# Patient Record
Sex: Male | Born: 1940 | Race: White | Hispanic: No | Smoking: Never smoker
Health system: Southern US, Community
[De-identification: ages and names within clinical notes are randomized; demographics above are authoritative.]

## PROBLEM LIST (undated history)

## (undated) DIAGNOSIS — K219 Gastro-esophageal reflux disease without esophagitis: Secondary | ICD-10-CM

## (undated) DIAGNOSIS — M79609 Pain in unspecified limb: Secondary | ICD-10-CM

## (undated) DIAGNOSIS — I10 Essential (primary) hypertension: Secondary | ICD-10-CM

## (undated) DIAGNOSIS — Z87442 Personal history of urinary calculi: Secondary | ICD-10-CM

## (undated) DIAGNOSIS — M1990 Unspecified osteoarthritis, unspecified site: Secondary | ICD-10-CM

## (undated) DIAGNOSIS — G4700 Insomnia, unspecified: Secondary | ICD-10-CM

## (undated) DIAGNOSIS — N529 Male erectile dysfunction, unspecified: Secondary | ICD-10-CM

## (undated) DIAGNOSIS — E7800 Pure hypercholesterolemia, unspecified: Secondary | ICD-10-CM

## (undated) DIAGNOSIS — E119 Type 2 diabetes mellitus without complications: Principal | ICD-10-CM

## (undated) DIAGNOSIS — R21 Rash and other nonspecific skin eruption: Secondary | ICD-10-CM

## (undated) DIAGNOSIS — Z86010 Personal history of colonic polyps: Secondary | ICD-10-CM

## (undated) DIAGNOSIS — K222 Esophageal obstruction: Secondary | ICD-10-CM

## (undated) HISTORY — PX: COLONOSCOPY: SHX174

## (undated) HISTORY — DX: Essential (primary) hypertension: I10

## (undated) HISTORY — DX: Esophageal obstruction: K22.2

## (undated) HISTORY — DX: Male erectile dysfunction, unspecified: N52.9

## (undated) HISTORY — DX: Rash and other nonspecific skin eruption: R21

## (undated) HISTORY — DX: Personal history of colonic polyps: Z86.010

## (undated) HISTORY — DX: Type 2 diabetes mellitus without complications: E11.9

## (undated) HISTORY — DX: Insomnia, unspecified: G47.00

## (undated) HISTORY — DX: Unspecified osteoarthritis, unspecified site: M19.90

## (undated) HISTORY — PX: TONSILLECTOMY AND ADENOIDECTOMY: SHX28

## (undated) HISTORY — PX: UPPER GASTROINTESTINAL ENDOSCOPY: SHX188

## (undated) HISTORY — DX: Gastro-esophageal reflux disease without esophagitis: K21.9

## (undated) HISTORY — DX: Pain in unspecified limb: M79.609

## (undated) HISTORY — DX: Personal history of urinary calculi: Z87.442

## (undated) HISTORY — DX: Pure hypercholesterolemia, unspecified: E78.00

---

## 2000-09-25 ENCOUNTER — Ambulatory Visit (HOSPITAL_COMMUNITY): Admission: RE | Admit: 2000-09-25 | Discharge: 2000-09-25 | Payer: Self-pay | Admitting: Gastroenterology

## 2000-09-25 ENCOUNTER — Encounter (INDEPENDENT_AMBULATORY_CARE_PROVIDER_SITE_OTHER): Payer: Self-pay | Admitting: Specialist

## 2000-12-03 HISTORY — PX: CATARACT EXTRACTION, BILATERAL: SHX1313

## 2000-12-30 ENCOUNTER — Ambulatory Visit (HOSPITAL_COMMUNITY): Admission: RE | Admit: 2000-12-30 | Discharge: 2000-12-30 | Payer: Self-pay | Admitting: Orthopedic Surgery

## 2000-12-30 ENCOUNTER — Encounter: Payer: Self-pay | Admitting: Orthopedic Surgery

## 2004-01-20 ENCOUNTER — Encounter: Admission: RE | Admit: 2004-01-20 | Discharge: 2004-01-20 | Payer: Self-pay | Admitting: Endocrinology

## 2004-08-24 ENCOUNTER — Ambulatory Visit: Payer: Self-pay | Admitting: Endocrinology

## 2005-01-05 ENCOUNTER — Ambulatory Visit: Payer: Self-pay | Admitting: Endocrinology

## 2005-04-03 ENCOUNTER — Ambulatory Visit: Payer: Self-pay | Admitting: Endocrinology

## 2005-05-25 ENCOUNTER — Ambulatory Visit: Payer: Self-pay | Admitting: Internal Medicine

## 2005-10-16 ENCOUNTER — Ambulatory Visit: Payer: Self-pay | Admitting: Endocrinology

## 2005-12-03 HISTORY — PX: REPLACEMENT TOTAL KNEE: SUR1224

## 2005-12-20 ENCOUNTER — Inpatient Hospital Stay (HOSPITAL_COMMUNITY): Admission: RE | Admit: 2005-12-20 | Discharge: 2005-12-24 | Payer: Self-pay | Admitting: Specialist

## 2006-04-30 ENCOUNTER — Ambulatory Visit: Payer: Self-pay | Admitting: Endocrinology

## 2006-05-21 ENCOUNTER — Ambulatory Visit: Payer: Self-pay | Admitting: Endocrinology

## 2006-05-23 ENCOUNTER — Ambulatory Visit: Payer: Self-pay | Admitting: Endocrinology

## 2006-06-13 ENCOUNTER — Ambulatory Visit: Payer: Self-pay | Admitting: Internal Medicine

## 2006-07-16 ENCOUNTER — Ambulatory Visit: Payer: Self-pay | Admitting: Endocrinology

## 2006-07-30 ENCOUNTER — Ambulatory Visit: Payer: Self-pay

## 2006-07-30 HISTORY — PX: OTHER SURGICAL HISTORY: SHX169

## 2006-10-15 ENCOUNTER — Ambulatory Visit: Payer: Self-pay | Admitting: Gastroenterology

## 2006-10-28 IMAGING — CR DG CHEST 2V
2 series · 2 of 2 positions shown · non-contrast
Comparison: None

CLINICAL DATA: Right knee osteoarthritis, preop

CHEST - 2 VIEW:

[view not recorded (1 of 2)]
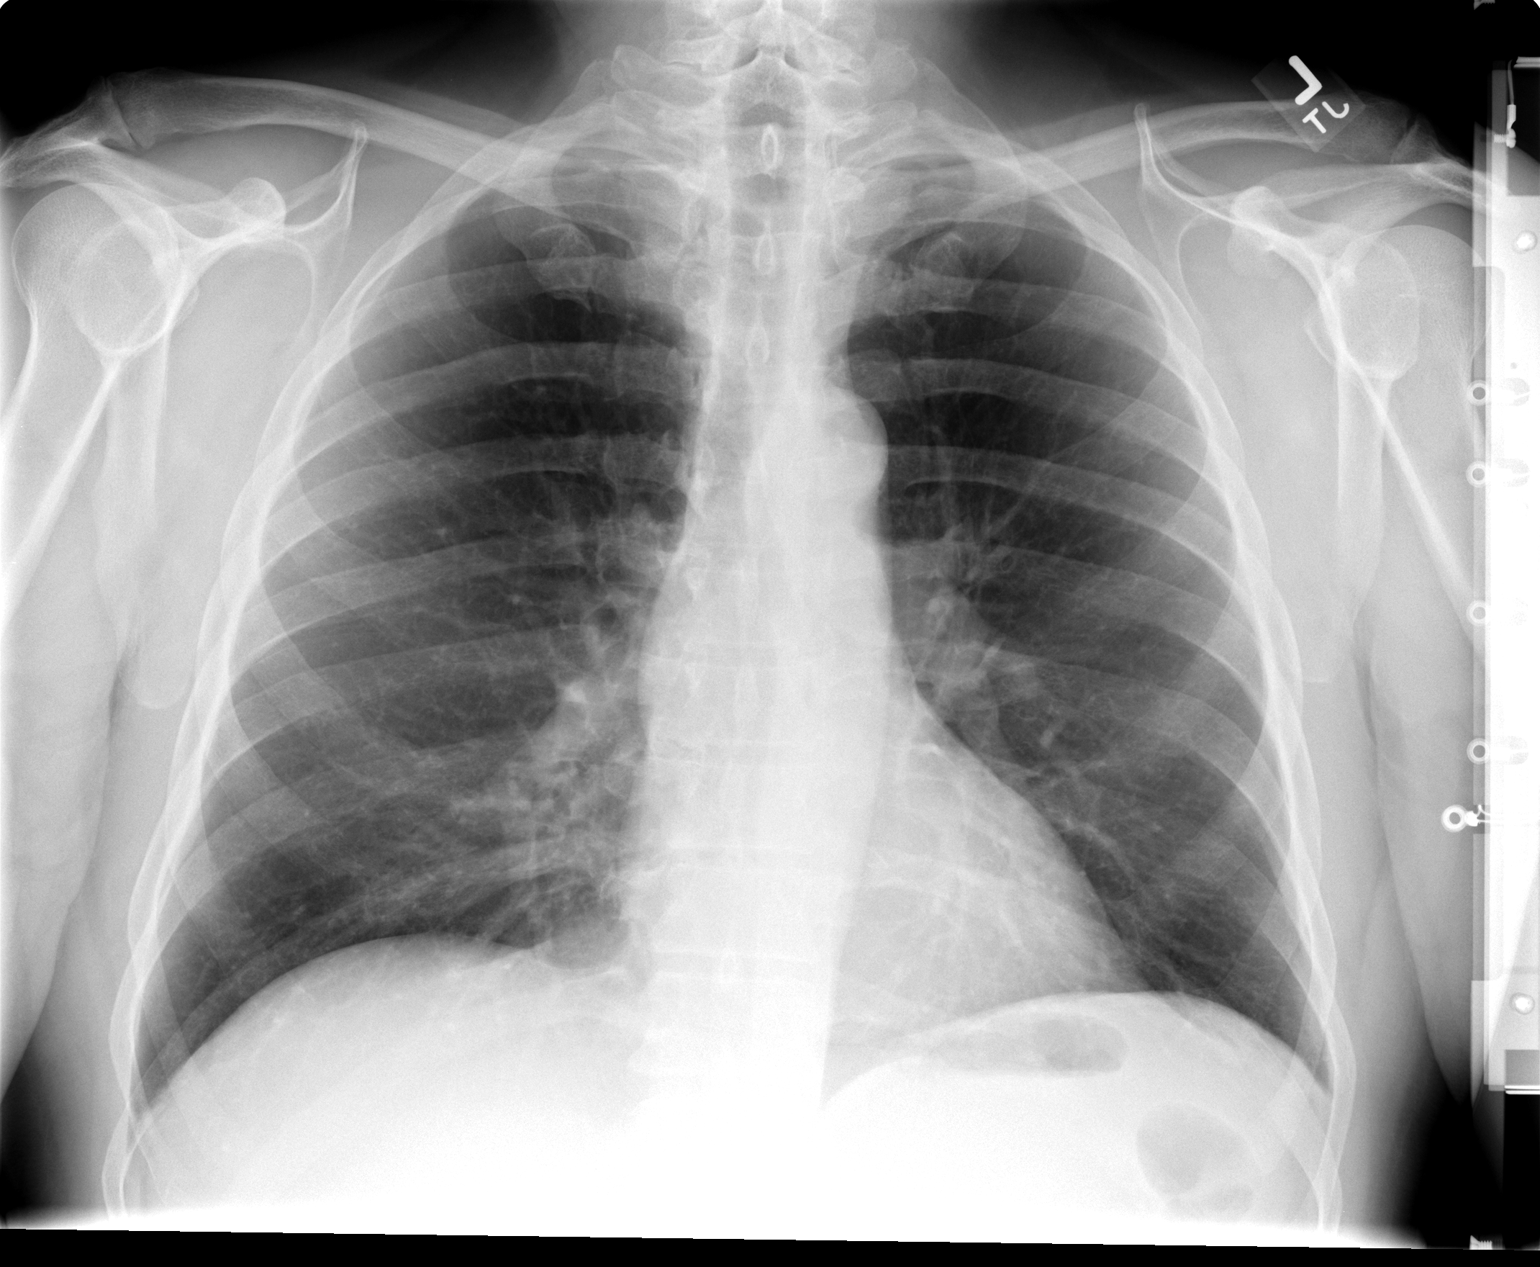

[view not recorded (2 of 2)]
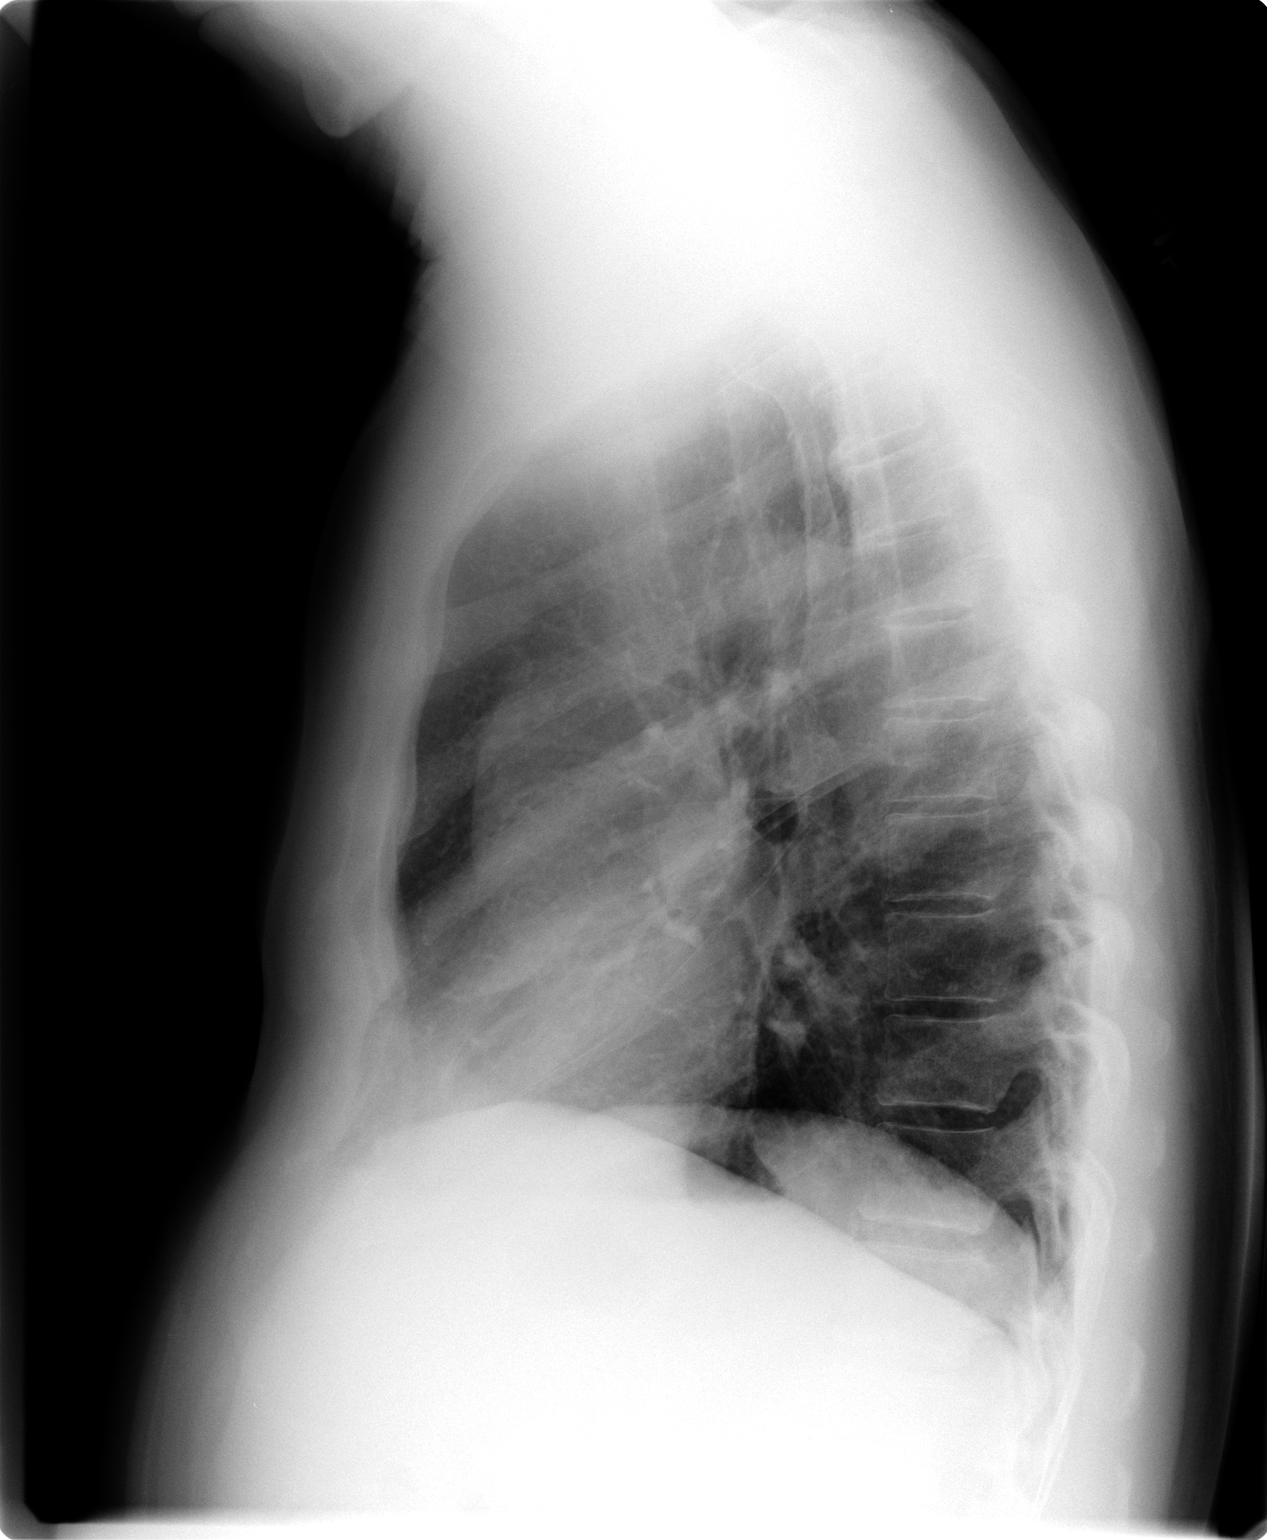

[2 of 2 positions shown; findings below may reference images not displayed]

FINDINGS: The heart size and mediastinal contours are within normal limits. 
Both lungs are clear.  The visualized skeletal structures are unremarkable.
IMPRESSION: No active cardiopulmonary disease

## 2006-11-04 IMAGING — CR DG KNEE 1-2V*R*
2 series · 2 of 2 positions shown · non-contrast
Comparison: none

CLINICAL DATA: Status-post right total knee arthroplasty. 
 RIGHT KNEE ? 2 VIEW:

[view not recorded (1 of 2)]
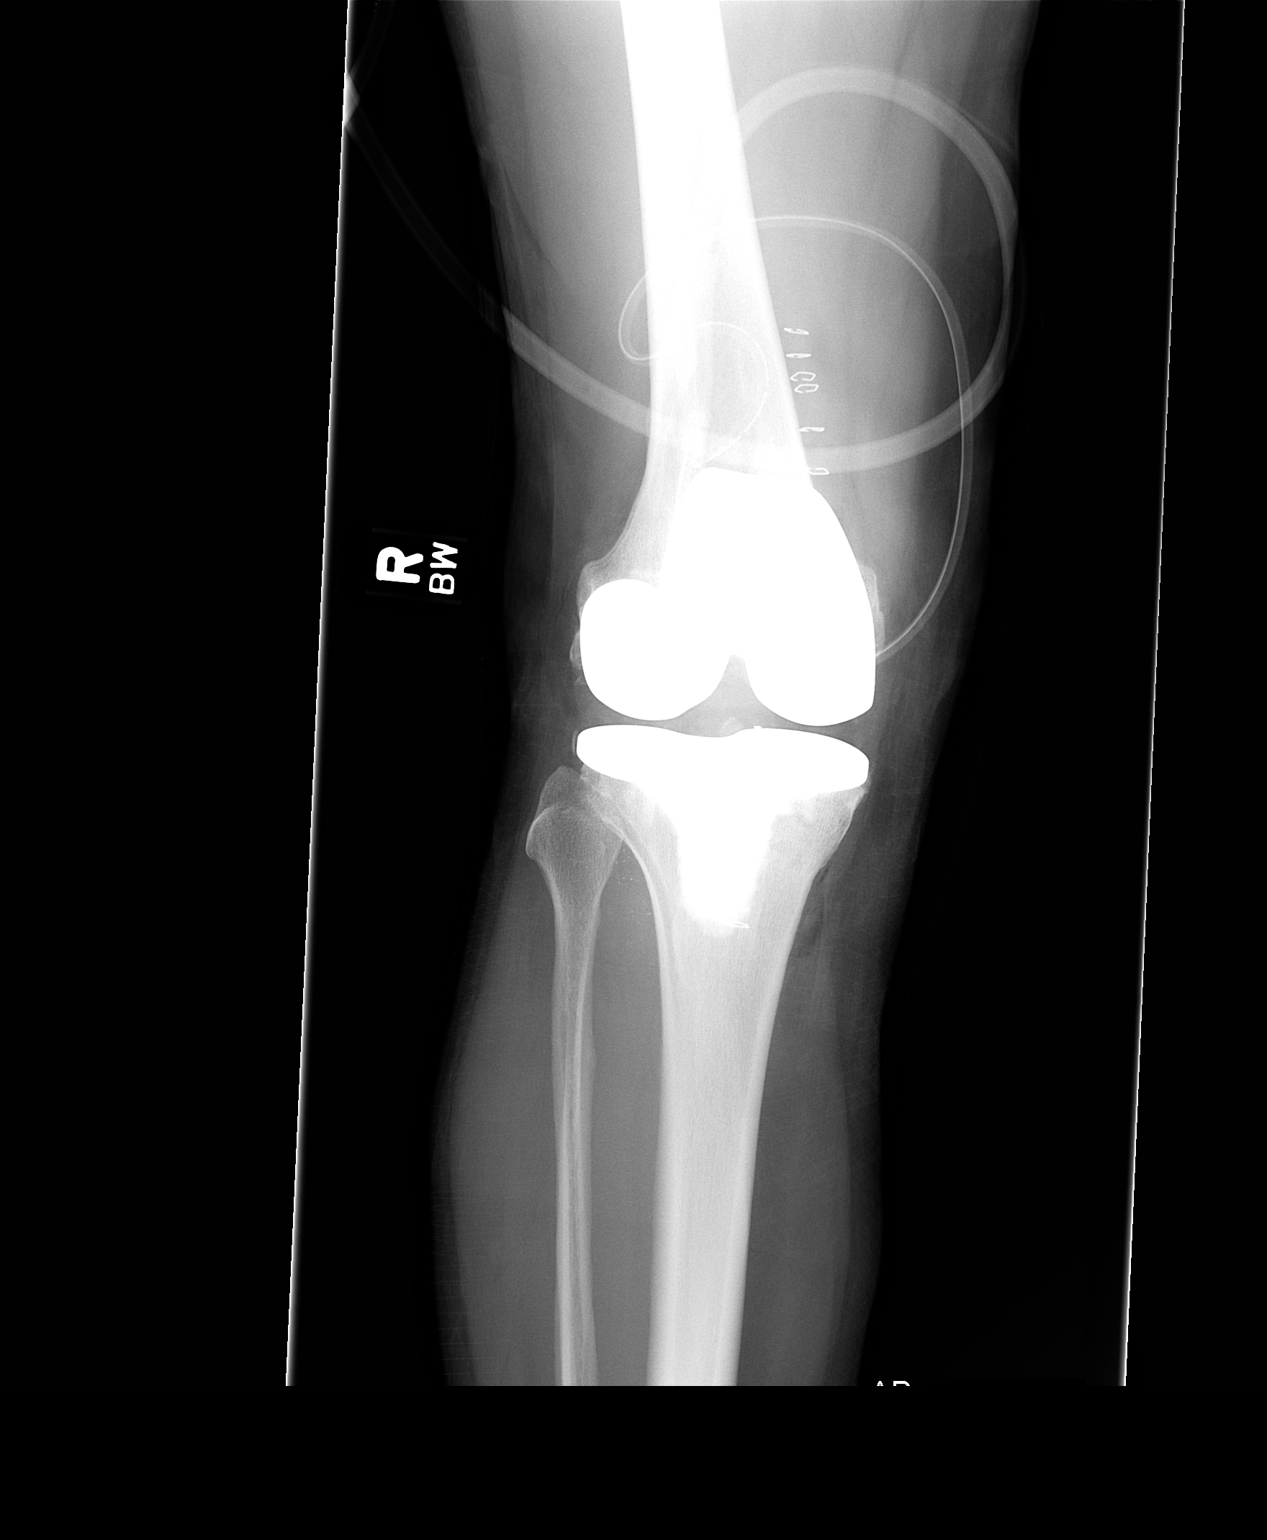

[view not recorded (2 of 2)]
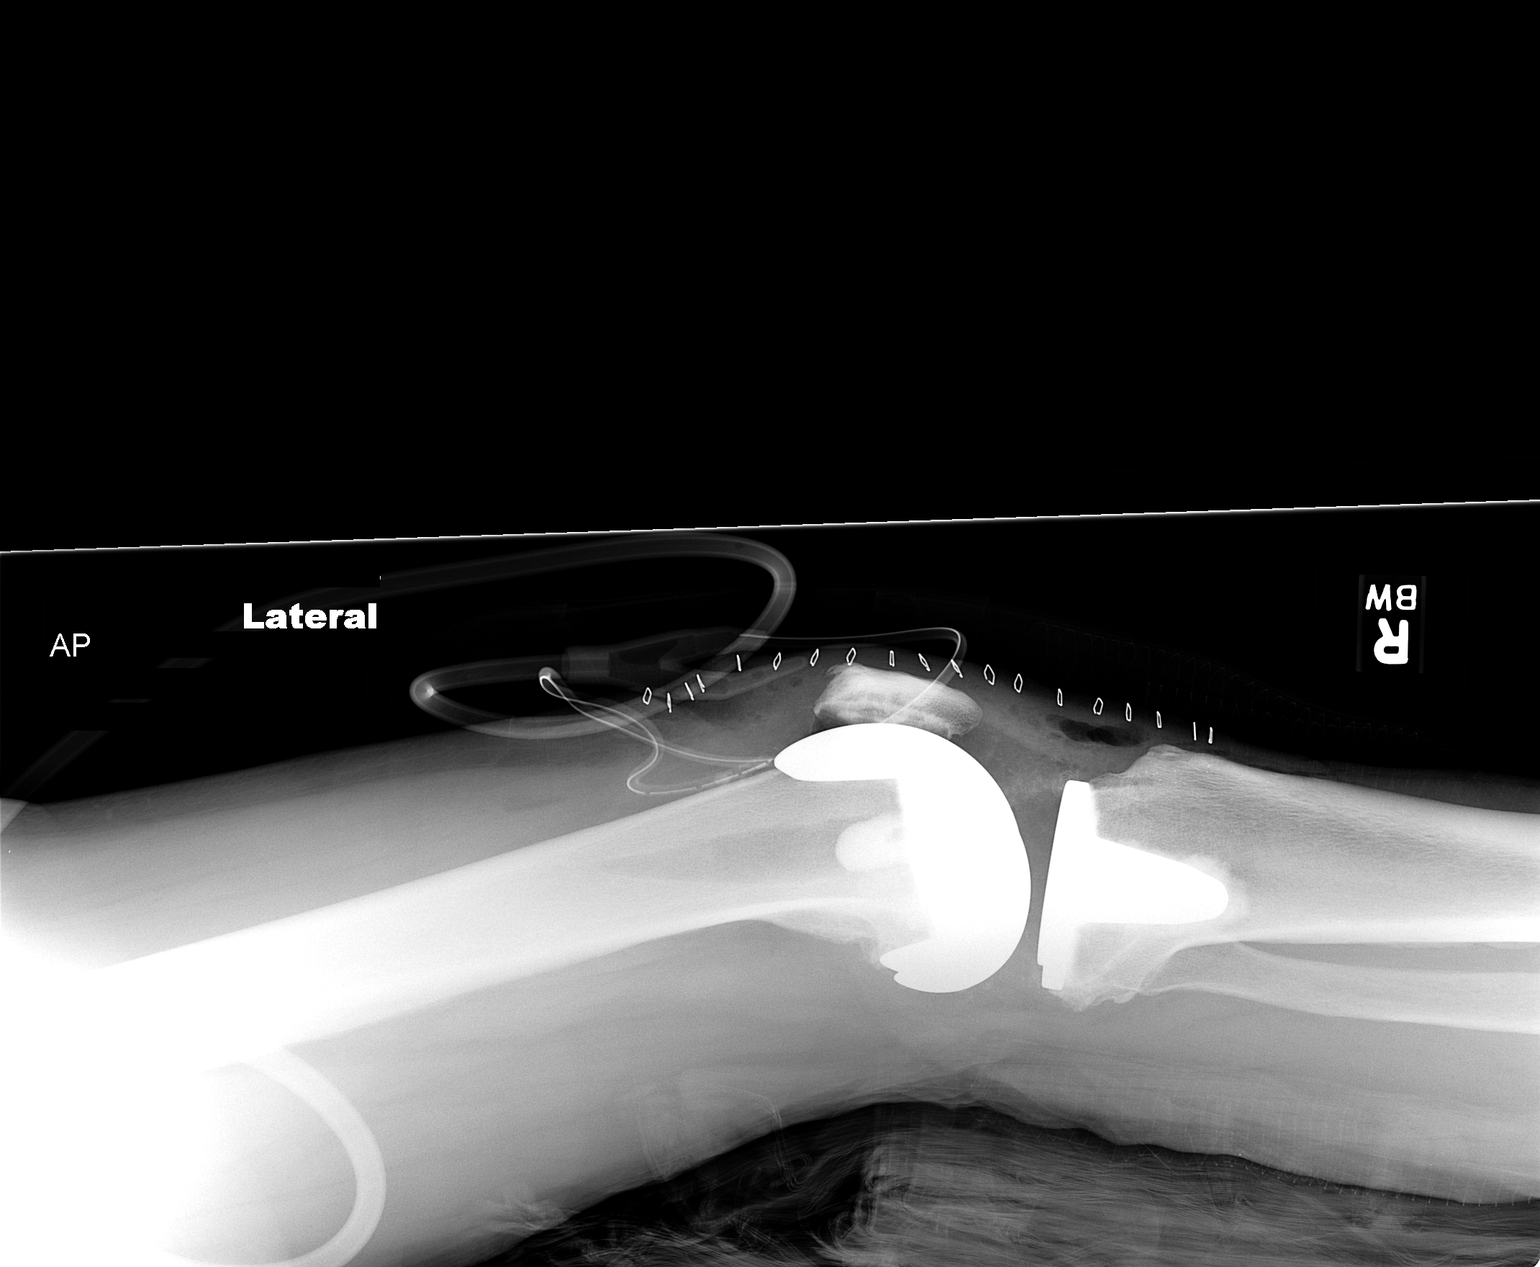

[2 of 2 positions shown; findings below may reference images not displayed]

FINDINGS: Two views obtained immediately after the surgical procedure demonstrate normal alignment of both tibial and femoral components.   Small amount of air in the anterior joint space on the cross-table lateral view.  Surgical drain in place.
IMPRESSION: Normal alignment following right knee arthroplasty.

## 2006-11-14 ENCOUNTER — Encounter (INDEPENDENT_AMBULATORY_CARE_PROVIDER_SITE_OTHER): Payer: Self-pay | Admitting: *Deleted

## 2006-11-14 ENCOUNTER — Ambulatory Visit: Payer: Self-pay | Admitting: Gastroenterology

## 2006-11-14 LAB — HM COLONOSCOPY

## 2007-07-30 ENCOUNTER — Encounter: Payer: Self-pay | Admitting: Endocrinology

## 2007-07-30 DIAGNOSIS — M1990 Unspecified osteoarthritis, unspecified site: Secondary | ICD-10-CM | POA: Insufficient documentation

## 2007-07-30 DIAGNOSIS — Z860100 Personal history of colon polyps, unspecified: Secondary | ICD-10-CM | POA: Insufficient documentation

## 2007-07-30 HISTORY — DX: Unspecified osteoarthritis, unspecified site: M19.90

## 2007-07-30 HISTORY — DX: Personal history of colon polyps, unspecified: Z86.0100

## 2007-07-30 HISTORY — DX: Personal history of colonic polyps: Z86.010

## 2007-10-22 ENCOUNTER — Ambulatory Visit: Payer: Self-pay | Admitting: Endocrinology

## 2007-10-22 DIAGNOSIS — E119 Type 2 diabetes mellitus without complications: Secondary | ICD-10-CM

## 2007-10-22 HISTORY — DX: Type 2 diabetes mellitus without complications: E11.9

## 2007-10-22 LAB — CONVERTED CEMR LAB
ALT: 28 units/L (ref 0–53)
Alkaline Phosphatase: 45 units/L (ref 39–117)
BUN: 14 mg/dL (ref 6–23)
Bilirubin, Direct: 0.1 mg/dL (ref 0.0–0.3)
Calcium: 9.6 mg/dL (ref 8.4–10.5)
Cholesterol: 299 mg/dL (ref 0–200)
Direct LDL: 216.5 mg/dL
Eosinophils Absolute: 0.2 10*3/uL (ref 0.0–0.6)
GFR calc Af Amer: 71 mL/min
GFR calc non Af Amer: 59 mL/min
HCT: 48.1 % (ref 39.0–52.0)
Hemoglobin, Urine: NEGATIVE
Hemoglobin: 16.8 g/dL (ref 13.0–17.0)
Hgb A1c MFr Bld: 6.5 % — ABNORMAL HIGH (ref 4.6–6.0)
Ketones, ur: NEGATIVE mg/dL
Leukocytes, UA: NEGATIVE
MCHC: 34.9 g/dL (ref 30.0–36.0)
Microalb Creat Ratio: 18.2 mg/g (ref 0.0–30.0)
Microalb, Ur: 1.7 mg/dL (ref 0.0–1.9)
Monocytes Relative: 8.3 % (ref 3.0–11.0)
Neutro Abs: 3.8 10*3/uL (ref 1.4–7.7)
Nitrite: NEGATIVE
Platelets: 247 10*3/uL (ref 150–400)
Specific Gravity, Urine: 1.015 (ref 1.000–1.03)
TSH: 1.09 microintl units/mL (ref 0.35–5.50)
Total CHOL/HDL Ratio: 7.9
Urobilinogen, UA: 0.2 (ref 0.0–1.0)
VLDL: 50 mg/dL — ABNORMAL HIGH (ref 0–40)
WBC: 7.2 10*3/uL (ref 4.5–10.5)
pH: 7 (ref 5.0–8.0)

## 2007-10-24 ENCOUNTER — Ambulatory Visit: Payer: Self-pay | Admitting: Endocrinology

## 2007-10-24 DIAGNOSIS — M79609 Pain in unspecified limb: Secondary | ICD-10-CM | POA: Insufficient documentation

## 2007-10-24 DIAGNOSIS — E7800 Pure hypercholesterolemia, unspecified: Secondary | ICD-10-CM

## 2007-10-24 HISTORY — DX: Pain in unspecified limb: M79.609

## 2007-10-24 HISTORY — DX: Pure hypercholesterolemia, unspecified: E78.00

## 2008-01-02 ENCOUNTER — Encounter: Payer: Self-pay | Admitting: Internal Medicine

## 2008-01-26 ENCOUNTER — Ambulatory Visit: Payer: Self-pay | Admitting: Endocrinology

## 2008-01-26 DIAGNOSIS — I10 Essential (primary) hypertension: Secondary | ICD-10-CM

## 2008-01-26 HISTORY — DX: Essential (primary) hypertension: I10

## 2008-01-29 ENCOUNTER — Telehealth (INDEPENDENT_AMBULATORY_CARE_PROVIDER_SITE_OTHER): Payer: Self-pay | Admitting: *Deleted

## 2008-02-26 ENCOUNTER — Telehealth (INDEPENDENT_AMBULATORY_CARE_PROVIDER_SITE_OTHER): Payer: Self-pay | Admitting: *Deleted

## 2008-02-27 ENCOUNTER — Ambulatory Visit: Payer: Self-pay | Admitting: Endocrinology

## 2008-04-09 ENCOUNTER — Ambulatory Visit (HOSPITAL_COMMUNITY): Admission: RE | Admit: 2008-04-09 | Discharge: 2008-04-09 | Payer: Self-pay | Admitting: Specialist

## 2008-04-09 LAB — CONVERTED CEMR LAB
Hgb A1c MFr Bld: 6.6 % — ABNORMAL HIGH (ref 4.6–6.0)
Total CHOL/HDL Ratio: 3.4
VLDL: 22 mg/dL (ref 0–40)

## 2008-09-17 ENCOUNTER — Encounter: Payer: Self-pay | Admitting: Endocrinology

## 2008-09-24 ENCOUNTER — Telehealth (INDEPENDENT_AMBULATORY_CARE_PROVIDER_SITE_OTHER): Payer: Self-pay | Admitting: *Deleted

## 2008-11-01 ENCOUNTER — Telehealth: Payer: Self-pay | Admitting: Endocrinology

## 2008-11-02 ENCOUNTER — Telehealth: Payer: Self-pay | Admitting: Endocrinology

## 2008-11-10 ENCOUNTER — Ambulatory Visit: Payer: Self-pay | Admitting: Endocrinology

## 2008-11-10 DIAGNOSIS — G4700 Insomnia, unspecified: Secondary | ICD-10-CM

## 2008-11-10 DIAGNOSIS — N529 Male erectile dysfunction, unspecified: Secondary | ICD-10-CM

## 2008-11-10 HISTORY — DX: Male erectile dysfunction, unspecified: N52.9

## 2008-11-10 HISTORY — DX: Insomnia, unspecified: G47.00

## 2008-11-16 ENCOUNTER — Telehealth (INDEPENDENT_AMBULATORY_CARE_PROVIDER_SITE_OTHER): Payer: Self-pay | Admitting: *Deleted

## 2008-12-06 ENCOUNTER — Telehealth: Payer: Self-pay | Admitting: Endocrinology

## 2009-03-11 ENCOUNTER — Telehealth (INDEPENDENT_AMBULATORY_CARE_PROVIDER_SITE_OTHER): Payer: Self-pay | Admitting: *Deleted

## 2009-03-22 ENCOUNTER — Ambulatory Visit: Payer: Self-pay | Admitting: Gastroenterology

## 2009-03-22 ENCOUNTER — Ambulatory Visit: Payer: Self-pay | Admitting: Endocrinology

## 2009-03-22 DIAGNOSIS — K222 Esophageal obstruction: Secondary | ICD-10-CM | POA: Insufficient documentation

## 2009-03-22 HISTORY — DX: Esophageal obstruction: K22.2

## 2009-05-06 ENCOUNTER — Ambulatory Visit: Payer: Self-pay | Admitting: Endocrinology

## 2009-05-06 DIAGNOSIS — R21 Rash and other nonspecific skin eruption: Secondary | ICD-10-CM

## 2009-05-06 HISTORY — DX: Rash and other nonspecific skin eruption: R21

## 2009-07-19 ENCOUNTER — Telehealth: Payer: Self-pay | Admitting: Endocrinology

## 2009-08-11 ENCOUNTER — Ambulatory Visit: Payer: Self-pay | Admitting: Endocrinology

## 2009-08-16 LAB — CONVERTED CEMR LAB
Cholesterol: 138 mg/dL (ref 0–200)
HDL: 31.5 mg/dL — ABNORMAL LOW (ref >39.00)
LDL Cholesterol: 77 mg/dL (ref 0–99)
Microalb, Ur: 0.5 mg/dL (ref 0.0–1.9)
VLDL: 30 mg/dL (ref 0.0–40.0)

## 2009-11-14 ENCOUNTER — Telehealth: Payer: Self-pay | Admitting: Endocrinology

## 2009-11-28 ENCOUNTER — Telehealth (INDEPENDENT_AMBULATORY_CARE_PROVIDER_SITE_OTHER): Payer: Self-pay | Admitting: *Deleted

## 2010-01-02 ENCOUNTER — Telehealth: Payer: Self-pay | Admitting: Endocrinology

## 2010-01-06 ENCOUNTER — Ambulatory Visit: Payer: Self-pay | Admitting: Endocrinology

## 2010-02-06 ENCOUNTER — Telehealth: Payer: Self-pay | Admitting: Endocrinology

## 2010-02-08 ENCOUNTER — Telehealth: Payer: Self-pay | Admitting: Endocrinology

## 2010-07-28 ENCOUNTER — Telehealth: Payer: Self-pay | Admitting: Endocrinology

## 2010-12-31 LAB — CONVERTED CEMR LAB
AST: 21 units/L (ref 0–37)
Albumin: 4 g/dL (ref 3.5–5.2)
Albumin: 4 g/dL (ref 3.5–5.2)
Alkaline Phosphatase: 44 units/L (ref 39–117)
Alkaline Phosphatase: 45 units/L (ref 39–117)
BUN: 15 mg/dL (ref 6–23)
BUN: 16 mg/dL (ref 6–23)
Basophils Absolute: 0 10*3/uL (ref 0.0–0.1)
Basophils Absolute: 0 10*3/uL (ref 0.0–0.1)
Basophils Relative: 0.1 % (ref 0.0–3.0)
Bilirubin Urine: NEGATIVE
Bilirubin Urine: NEGATIVE
CO2: 29 meq/L (ref 19–32)
Cholesterol: 111 mg/dL (ref 0–200)
Creatinine, Ser: 1.1 mg/dL (ref 0.4–1.5)
Creatinine,U: 124.4 mg/dL
Crystals: NEGATIVE
Eosinophils Absolute: 0.4 10*3/uL (ref 0.0–0.7)
Eosinophils Absolute: 0.4 10*3/uL (ref 0.0–0.7)
Eosinophils Relative: 3.7 % (ref 0.0–5.0)
GFR calc Af Amer: 86 mL/min
GFR calc non Af Amer: 71 mL/min
Glucose, Bld: 124 mg/dL — ABNORMAL HIGH (ref 70–99)
Glucose, Bld: 165 mg/dL — ABNORMAL HIGH (ref 70–99)
HCT: 45.9 % (ref 39.0–52.0)
HCT: 46.2 % (ref 39.0–52.0)
Hemoglobin, Urine: NEGATIVE
Hemoglobin: 15 g/dL (ref 13.0–17.0)
Hgb A1c MFr Bld: 6.8 % — ABNORMAL HIGH (ref 4.6–6.0)
Hgb A1c MFr Bld: 6.8 % — ABNORMAL HIGH (ref 4.6–6.5)
Ketones, ur: NEGATIVE mg/dL
Leukocytes, UA: NEGATIVE
Lymphs Abs: 1.8 10*3/uL (ref 0.7–4.0)
MCHC: 32.7 g/dL (ref 30.0–36.0)
MCHC: 33.3 g/dL (ref 30.0–36.0)
MCV: 89.6 fL (ref 78.0–100.0)
MCV: 91.1 fL (ref 78.0–100.0)
Microalb Creat Ratio: 4.8 mg/g (ref 0.0–30.0)
Monocytes Absolute: 0.4 10*3/uL (ref 0.1–1.0)
Monocytes Absolute: 0.9 10*3/uL (ref 0.1–1.0)
Neutro Abs: 3.3 10*3/uL (ref 1.4–7.7)
Neutro Abs: 7.4 10*3/uL (ref 1.4–7.7)
Neutrophils Relative %: 65 % (ref 43.0–77.0)
Nitrite: NEGATIVE
PSA: 1.06 ng/mL (ref 0.10–4.00)
Platelets: 226 10*3/uL (ref 150.0–400.0)
Potassium: 4.5 meq/L (ref 3.5–5.1)
RBC: 5.16 M/uL (ref 4.22–5.81)
RDW: 13.5 % (ref 11.5–14.6)
Sodium: 141 meq/L (ref 135–145)
Specific Gravity, Urine: 1.025 (ref 1.000–1.03)
TSH: 1.25 microintl units/mL (ref 0.35–5.50)
TSH: 1.47 microintl units/mL (ref 0.35–5.50)
Total Protein, Urine: NEGATIVE mg/dL
Total Protein: 7 g/dL (ref 6.0–8.3)
Total Protein: 7.2 g/dL (ref 6.0–8.3)
Urine Glucose: NEGATIVE mg/dL
Urobilinogen, UA: 0.2 (ref 0.0–1.0)
Urobilinogen, UA: 0.2 (ref 0.0–1.0)
VLDL: 27 mg/dL (ref 0–40)

## 2011-01-04 NOTE — Progress Notes (Signed)
Summary: temazepam  Phone Note Refill Request Message from:  Fax from Pharmacy on July 28, 2010 1:16 PM  Refills Requested: Medication #1:  TEMAZEPAM 30 MG  CAPS at bedtime as needed sleep   Dosage confirmed as above?Dosage Confirmed   Last Refilled: 06/23/2010   Notes: [facility] fax [phone] please advise  Initial call taken by: Brenton Grills MA,  July 28, 2010 1:16 PM  Follow-up for Phone Call        i printed Follow-up by: Minus Breeding MD,  July 28, 2010 1:53 PM  Additional Follow-up for Phone Call Additional follow up Details #1::        faxed to [facility] Additional Follow-up by: Brenton Grills MA,  July 28, 2010 2:11 PM    Prescriptions: TEMAZEPAM 30 MG  CAPS (TEMAZEPAM) at bedtime as needed sleep  #30 x 5   Entered and Authorized by:   Minus Breeding MD   Signed by:   Minus Breeding MD on 07/28/2010   Method used:   Print then Give to Patient   RxID:   [id number]

## 2011-01-04 NOTE — Progress Notes (Signed)
  Phone Note Refill Request Message from:  Fax from Pharmacy on February 06, 2010 10:34 AM  Refills Requested: Medication #1:  TEMAZEPAM 30 MG  CAPS at bedtime as needed sleep   Dosage confirmed as above?Dosage Confirmed Please advise. [facility] [address]  Initial call taken by: Josph Macho RMA,  February 06, 2010 10:34 AM  Follow-up for Phone Call        i printed Follow-up by: Minus Breeding MD,  February 06, 2010 12:32 PM  Additional Follow-up for Phone Call Additional follow up Details #1::        patient called and informed Additional Follow-up by: Sydell Axon,  February 06, 2010 12:56 PM    Prescriptions: TEMAZEPAM 30 MG  CAPS (TEMAZEPAM) at bedtime as needed sleep  #30 x 5   Entered and Authorized by:   Minus Breeding MD   Signed by:   Minus Breeding MD on 02/06/2010   Method used:   Print then Give to Patient   RxID:   [id number]   Appended Document:  rx faxed to pharmacy

## 2011-01-04 NOTE — Progress Notes (Signed)
Summary: labs  Phone Note Call from Patient Call back at Home Phone [phone]   Caller: Patient Summary of Call: pt called requesting results of labs done at last OV. okay to tell pt labs normal, continue same meds? Initial call taken by: Margaret Pyle, CMA,  February 08, 2010 3:15 PM  Follow-up for Phone Call        all very good, including a1c of 6.8 Follow-up by: Minus Breeding MD,  February 08, 2010 5:52 PM  Additional Follow-up for Phone Call Additional follow up Details #1::        left message on pt's VM stating the above Additional Follow-up by: Sherese Christopher February 09, 2010 8:24 AM

## 2011-01-04 NOTE — Progress Notes (Signed)
Summary: RX Request  Phone Note Call from Patient Call back at Home Phone [phone]   Summary of Call: Patient left message on triage that he needs an prescription renewed. I spoke with the patient and his CPX is scheduled for Friday. Patient was wondering if he could get a short term prescription for Temazepam or if we could ahead and refill, he left the remainder of his pills out of town. Please advise. Initial call taken by: Lucious Groves,  January 02, 2010 4:35 PM  Follow-up for Phone Call        i printed Follow-up by: Minus Breeding MD,  January 02, 2010 4:52 PM  Additional Follow-up for Phone Call Additional follow up Details #1::        rx faxed to pharmacy per pt request Additional Follow-up by: Margaret Pyle, CMA,  January 02, 2010 4:56 PM    Prescriptions: TEMAZEPAM 30 MG  CAPS (TEMAZEPAM) at bedtime as needed sleep  #30 x 0   Entered and Authorized by:   Minus Breeding MD   Signed by:   Minus Breeding MD on 01/02/2010   Method used:   Print then Give to Patient   RxID:   [id number]

## 2011-01-04 NOTE — Assessment & Plan Note (Signed)
Summary: YEARLY  STC   Vital Signs:  Patient profile:   70 year old male Height:      67 inches (170.18 cm) Weight:      180.25 pounds (81.93 kg) O2 Sat:      95 % on Room air Temp:     98.3 degrees F (36.83 degrees C) oral Pulse rate:   63 / minute BP sitting:   138 / 80  (left arm) Cuff size:   large  Vitals Entered By: Josph Macho CMA (January 06, 2010 10:04 AM)  O2 Flow:  Room air CC: Yearly/pt states he is no longer taking Triamcinolone, EQ Omepraxole, Doxycycline, or Crestor/ CF  Vision Screening:Left eye w/o correction: 20 / 20 Right Eye w/o correction: 20 / 20 Both eyes w/o correction:  20/ 20        Vision Entered By: Josph Macho CMA (January 06, 2010 10:45 AM)   CC:  Yearly/pt states he is no longer taking Triamcinolone, EQ Omepraxole, Doxycycline, and or Crestor/ CF.  History of Present Illness: here for regular wellness examination.  He's feeling pretty well in general, and does not drink or smoke.  he is physically active.  he denies h/o depression.  he is not at risk for falls.  home environment is safe.  he lives independently.     Current Medications (verified): 1)  Aspirin 325 Mg  Tbec (Aspirin) .... Take 1 By Mouth Qd 2)  Crestor 40 Mg  Tabs (Rosuvastatin Calcium) .... Take 1 By Mouth Qd 3)  Temazepam 30 Mg  Caps (Temazepam) .... At Bedtime As Needed Sleep 4)  Triamcinolone Acetonide 0.5 % Crea (Triamcinolone Acetonide) .... Three Times A Day As Needed Itching 5)  Eq Omeprazole 20 Mg Tbec (Omeprazole) .Marland Kitchen.. 1 Tab Once Daily 6)  Doxycycline Hyclate 100 Mg Tabs (Doxycycline Hyclate) .Marland Kitchen.. 1 Tab Bid 7)  Crestor 40 Mg Tabs (Rosuvastatin Calcium) .Marland Kitchen.. 1 Qhs 8)  Metformin Hcl 500 Mg Xr24h-Tab (Metformin Hcl) .... 2 Tabs Qam  Allergies (verified): No Known Drug Allergies  Past History:  Past Medical History: Last updated: 11/10/2008 Osteoarthritis of spine Gastritis  HEPATOTOXICITY, DRUG-INDUCED, RISK OF (ICD-V58.69) ESSENTIAL HYPERTENSION,  BENIGN (ICD-401.1) PAIN IN SOFT TISSUES OF LIMB (ICD-729.5) HYPERCHOLESTEROLEMIA (ICD-272.0) DIABETES MELLITUS, TYPE II (ICD-250.00) SPECIAL SCREENING MALIGNANT NEOPLASM OF PROSTATE (ICD-V76.44) ROUTINE GENERAL MEDICAL EXAM@HEALTH  CARE FACL (ICD-V70.0) OSTEOARTHRITIS (ICD-715.90) COLONIC POLYPS, HX OF (ICD-V12.72)  Past Surgical History: Total knee replacement (R 2007) Lower Arterial (07/30/2006) cataracts (bilat) 2002  Social History: 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  The patient denies fever, vision loss, decreased hearing, chest pain, syncope, dyspnea on exertion, prolonged cough, headaches, abdominal pain, melena, hematochezia, severe indigestion/heartburn, hematuria, suspicious skin lesions, and depression.         denies decreased urinary stream.  Physical Exam  General:  normal appearance.   Head:  head: no deformity eyes: no periorbital swelling, no proptosis external nose and ears are normal mouth: no lesion seen  Ears:  hearing is normal Neck:  Supple without thyroid enlargement or tenderness. No cervical lymphadenopathy, neck masses or tracheal deviation.  Lungs:  Clear to auscultation bilaterally. Normal respiratory effort.  Heart:  Regular rate and rhythm without murmurs or gallops noted. Normal S1,S2.   Abdomen:  abdomen is soft, nontender.  no hepatosplenomegaly.   not distended.  no hernia  Rectal:  normal external and  internal exam.  heme neg  Prostate:  Normal size prostate without masses or tenderness.  Msk:  muscle bulk and strength are grossly normal.  no obvious joint swelling.  gait is normal and steady  Pulses:  dorsalis pedis intact bilat.  no carotid bruit  Extremities:  no deformity.  no ulcer on the feet.  feet are of normal color and temp.  no edema  Neurologic:  cn 2-12 grossly intact.   readily moves all 4's.   sensation is intact to  touch on the feet  Skin:  normal texture and temp.  no rash.  not diaphoretic  Cervical Nodes:  No significant adenopathy.  Psych:  Alert and cooperative; normal mood and affect; normal attention span and concentration.     Impression & Recommendations:  Problem # 1:  ROUTINE GENERAL MEDICAL EXAM@HEALTH  CARE FACL (ICD-V70.0)  Other Orders: EKG w/ Interpretation (93000) TLB-BMP (Basic Metabolic Panel-BMET) (80048-METABOL) TLB-CBC Platelet - w/Differential (85025-CBCD) TLB-TSH (Thyroid Stimulating Hormone) (84443-TSH) TLB-Hepatic/Liver Function Pnl (80076-HEPATIC) TLB-A1C / Hgb A1C (Glycohemoglobin) (83036-A1C) TLB-PSA (Prostate Specific Antigen) (84153-PSA) TLB-Udip w/ Micro (81001-URINE) Initial Medicare Preventive Exam (U0454)   Patient Instructions: 1)  tests are being ordered for you today.  a few days after the test(s), please call [phone] to hear your test results. 2)  pending the test results, please continue the same medications for now. 3)  please consider these measures for your health:  minimize alcohol.  do not use tobacco products.  have a colonoscopy at least every 10 years from age 53.  keep firearms safely stored.  always use seat belts.  have working smoke alarms in your home.  see the dentist regularly.  never drive under the influence of alcohol or drugs (including prescription drugs).     Prevention & Chronic Care Immunizations   Influenza vaccine: Historical  (09/16/2008)    Tetanus booster: 05/23/2006: Td    Pneumococcal vaccine: Pneumovax  (10/24/2007)    H. zoster vaccine: Not documented  Colorectal Screening   Hemoccult: Not documented    Colonoscopy: Done  (11/14/2006)  Other Screening   PSA: 0.83  (11/10/2008)   PSA ordered.   Smoking status: never  (07/30/2007)  Diabetes Mellitus   HgbA1C: 6.6  (08/16/2009)    Eye exam: Not documented    Foot exam: Not documented   High risk foot: Not documented   Foot care education: Not  documented    Urine microalbumin/creatinine ratio: 6.0  (08/16/2009)  Lipids   Total Cholesterol: 138  (08/16/2009)   LDL: 77  (08/16/2009)   LDL Direct: 216.5  (10/22/2007)   HDL: 31.50  (08/16/2009)   Triglycerides: 150.0  (08/16/2009)    SGOT (AST): 21  (11/10/2008)   SGPT (ALT): 25  (11/10/2008)   Alkaline phosphatase: 44  (11/10/2008)   Total bilirubin: 0.8  (11/10/2008)  Hypertension   Last Blood Pressure: 138 / 80  (01/06/2010)   Serum creatinine: 1.1  (11/10/2008)   Serum potassium 4.7  (11/10/2008)  Self-Management Support :    Diabetes self-management support: Not documented    Hypertension self-management support: Not documented    Lipid self-management support: Not documented

## 2011-02-01 ENCOUNTER — Telehealth: Payer: Self-pay | Admitting: Endocrinology

## 2011-02-08 NOTE — Progress Notes (Signed)
Summary: Rx refill req  Phone Note Refill Request Message from:  Patient on February 01, 2011 4:04 PM  Refills Requested: Medication #1:  TEMAZEPAM 30 MG  CAPS at bedtime as needed sleep   Dosage confirmed as above?Dosage Confirmed   Supply Requested: [facility] months [facility]  Pt was transferred to schedule CPX   Method Requested: Electronic Initial call taken by: Margaret Pyle, CMA,  February 01, 2011 4:05 PM  Follow-up for Phone Call        i printed  Additional Follow-up for Phone Call Additional follow up Details #1::        Rx faxed to pharmacy per pt req Additional Follow-up by: Margaret Pyle, CMA,  February 01, 2011 4:25 PM    Prescriptions: TEMAZEPAM 30 MG  CAPS (TEMAZEPAM) at bedtime as needed sleep  #30 x 0   Entered and Authorized by:   Minus Breeding MD   Signed by:   Minus Breeding MD on 02/01/2011   Method used:   Print then Give to Patient   RxID:   [id number]

## 2011-02-16 ENCOUNTER — Encounter: Payer: Self-pay | Admitting: Endocrinology

## 2011-02-16 ENCOUNTER — Other Ambulatory Visit: Payer: Self-pay | Admitting: Endocrinology

## 2011-02-16 ENCOUNTER — Encounter (INDEPENDENT_AMBULATORY_CARE_PROVIDER_SITE_OTHER): Payer: Medicare Other | Admitting: Endocrinology

## 2011-02-16 ENCOUNTER — Other Ambulatory Visit: Payer: Medicare Other

## 2011-02-16 DIAGNOSIS — Z0000 Encounter for general adult medical examination without abnormal findings: Secondary | ICD-10-CM

## 2011-02-16 DIAGNOSIS — Z79899 Other long term (current) drug therapy: Secondary | ICD-10-CM

## 2011-02-16 DIAGNOSIS — E785 Hyperlipidemia, unspecified: Secondary | ICD-10-CM

## 2011-02-16 DIAGNOSIS — E7800 Pure hypercholesterolemia, unspecified: Secondary | ICD-10-CM

## 2011-02-16 DIAGNOSIS — E119 Type 2 diabetes mellitus without complications: Secondary | ICD-10-CM

## 2011-02-16 DIAGNOSIS — N529 Male erectile dysfunction, unspecified: Secondary | ICD-10-CM

## 2011-02-16 DIAGNOSIS — Z125 Encounter for screening for malignant neoplasm of prostate: Secondary | ICD-10-CM

## 2011-02-16 DIAGNOSIS — K222 Esophageal obstruction: Secondary | ICD-10-CM

## 2011-02-16 LAB — LIPID PANEL
Cholesterol: 318 mg/dL — ABNORMAL HIGH (ref 0–200)
Total CHOL/HDL Ratio: 8
Triglycerides: 223 mg/dL — ABNORMAL HIGH (ref 0.0–149.0)
VLDL: 44.6 mg/dL — ABNORMAL HIGH (ref 0.0–40.0)

## 2011-02-16 LAB — CBC WITH DIFFERENTIAL/PLATELET
Basophils Relative: 0.4 % (ref 0.0–3.0)
Eosinophils Absolute: 0.3 10*3/uL (ref 0.0–0.7)
Hemoglobin: 16.7 g/dL (ref 13.0–17.0)
Lymphs Abs: 2.1 10*3/uL (ref 0.7–4.0)
MCHC: 34.6 g/dL (ref 30.0–36.0)
MCV: 90.7 fl (ref 78.0–100.0)
Monocytes Absolute: 0.8 10*3/uL (ref 0.1–1.0)
Neutro Abs: 4.2 10*3/uL (ref 1.4–7.7)
RBC: 5.33 Mil/uL (ref 4.22–5.81)

## 2011-02-16 LAB — URINALYSIS, ROUTINE W REFLEX MICROSCOPIC
Hgb urine dipstick: NEGATIVE
Nitrite: NEGATIVE
Total Protein, Urine: NEGATIVE
pH: 6 (ref 5.0–8.0)

## 2011-02-16 LAB — BASIC METABOLIC PANEL
Calcium: 9.1 mg/dL (ref 8.4–10.5)
Chloride: 104 mEq/L (ref 96–112)
Creatinine, Ser: 1.2 mg/dL (ref 0.4–1.5)
Sodium: 139 mEq/L (ref 135–145)

## 2011-02-16 LAB — HEPATIC FUNCTION PANEL
Bilirubin, Direct: 0.1 mg/dL (ref 0.0–0.3)
Total Bilirubin: 0.7 mg/dL (ref 0.3–1.2)

## 2011-03-01 NOTE — Assessment & Plan Note (Signed)
Summary: PER DAHLIA--YEARLY---STC   Vital Signs:  Patient profile:   70 year old male Height:      67 inches (170.18 cm) Weight:      175.25 pounds (79.66 kg) BMI:     27.55 O2 Sat:      97 % on Room air Temp:     97.7 degrees F (36.50 degrees C) oral Pulse rate:   59 / minute Pulse rhythm:   regular BP sitting:   160 / 88  (left arm) Cuff size:   large  Vitals Entered By: Brenton Grills CMA Duncan Dull) (February 16, 2011 9:12 AM)  O2 Flow:  Room air CC: Yearly CPX/aj Is Patient Diabetic? Yes Comments Pt states he is not taking any medications.   CC:  Yearly CPX/aj.  History of Present Illness: here for regular wellness examination.  He's feeling pretty well in general, and does not drink or smoke.   Current Medications (verified): 1)  Aspirin 325 Mg  Tbec (Aspirin) .... Take 1 By Mouth Qd 2)  Crestor 40 Mg  Tabs (Rosuvastatin Calcium) .... Take 1 By Mouth Qd 3)  Temazepam 30 Mg  Caps (Temazepam) .... At Bedtime As Needed Sleep 4)  Metformin Hcl 500 Mg Xr24h-Tab (Metformin Hcl) .... 2 Tabs Qam  Allergies (verified): No Known Drug Allergies  Family History: 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    Physical Exam  General:  normal appearance.   Head:  head: no deformity eyes: no periorbital swelling, no proptosis external nose and ears are normal mouth: no lesion seen Neck:  Supple without thyroid enlargement or tenderness.  Heart:  Regular rate and rhythm without murmurs or gallops  noted. Normal S1,S2.   Abdomen:  abdomen is soft, nontender.  no hepatosplenomegaly.   not distended.  no hernia  Rectal:  normal external and internal exam.  heme neg  Prostate:  Normal size prostate without masses or tenderness.  Msk:  muscle bulk and strength are grossly normal.  no obvious joint swelling.  gait is normal and steady  Neurologic:  cn 2-12 grossly intact.   readily moves all 4's.   sensation is intact to touch on the feet  Skin:  normal texture and temp.  no rash.  not diaphoretic  Cervical Nodes:  No significant adenopathy.  Psych:  Alert and cooperative; normal mood and affect; normal attention span and concentration.   Additional Exam:  SEPARATE EVALUATION FOLLOWS--EACH PROBLEM HERE IS NEW, NOT RESPONDING TO TREATMENT, OR POSES SIGNIFICANT RISK TO THE PATIENT'S HEALTH: HISTORY OF THE PRESENT ILLNESS: pt says he stopped his meds due to a gap in his prescription coverage.  denies weight loss. PAST MEDICAL HISTORY reviewed and up to date today REVIEW OF SYSTEMS: denies chest pain PHYSICAL EXAMINATION: dorsalis pedis intact bilat.  no carotid bruit no deformity.  no ulcer on the feet.  feet are of normal color and temp.  no edema clear to auscultation.  no respiratory distress LAB/XRAY RESULTS: lipids and a1c are noted IMPRESSION: dm, needs increased rx dyslipidemia, needs increased rx PLAN: see instruction page    Impression &  Recommendations:  Problem # 1:  ROUTINE GENERAL MEDICAL EXAM@HEALTH  CARE FACL (ICD-V70.0)  Problem # 2:  hypertension ? situational component--we'll follow  Other Orders: EKG w/ Interpretation (93000) TLB-Lipid Panel (80061-LIPID) TLB-BMP (Basic Metabolic Panel-BMET) (80048-METABOL) TLB-Hepatic/Liver Function Pnl (80076-HEPATIC) TLB-CBC Platelet - w/Differential (85025-CBCD) TLB-TSH (Thyroid Stimulating Hormone) (84443-TSH) TLB-A1C / Hgb A1C (Glycohemoglobin) (83036-A1C) TLB-Microalbumin/Creat Ratio, Urine  (82043-MALB) TLB-Udip w/ Micro (81001-URINE) TLB-PSA (Prostate Specific Antigen) (84153-PSA) TLB-Testosterone, Total (84403-TESTO) Est. Patient Level III (11914) Est. Patient 65& > (78295)  Patient Instructions: 1)  blood tests are being ordered for you today.  please call [phone] to hear your test results. 2)  please consider these measures for your health:  minimize alcohol.  do not use tobacco products.  have a colonoscopy at least every 10 years from age 48.  keep firearms safely stored.  always use seat belts.  have working smoke alarms in your home.  see an eye doctor and dentist regularly.  never drive under the influence of alcohol or drugs (including prescription drugs).  those with fair skin should take precautions against the sun. 3)  please let me know what your wishes would be, if artificial life support measures should become necessary.  it is critically important to prevent falling down (keep floor areas well-lit, dry, and free of loose objects). 4)  please come in for a blood-presure check in the next few weeks (please call the day before, so we can expect you). 5)  Please schedule a follow-up appointment in 6 months. 6)  (update: i left message on phone-tree:  resume crestor, asa, and metformin) Prescriptions: METFORMIN HCL 500 MG XR24H-TAB (METFORMIN HCL) 2 tabs qam  #60 x 11   Entered and Authorized by:   Minus Breeding MD   Signed by:   Minus Breeding MD on 02/16/2011   Method used:   Electronically to        [facility] (retail)       [address]       [place], [place]  [address]       Ph: [phone]       Fax: [phone]   RxID:   [id number] CRESTOR 40 MG  TABS (ROSUVASTATIN CALCIUM) take 1 by mouth qd  #30 Tablet x 11   Entered and Authorized by:   Minus Breeding MD   Signed by:   Minus Breeding MD on 02/16/2011   Method used:   Electronically to        [facility] (retail)       [address]       [place], [place]  [address]       Ph: [phone]       Fax: [phone]   RxID:   [id number]    Orders Added: 1)  EKG w/ Interpretation [93000] 2)  TLB-Lipid Panel [80061-LIPID] 3)  TLB-BMP (Basic Metabolic Panel-BMET) [80048-METABOL] 4)  TLB-Hepatic/Liver Function Pnl [80076-HEPATIC] 5)  TLB-CBC Platelet - w/Differential [85025-CBCD] 6)  TLB-TSH (Thyroid Stimulating Hormone) [84443-TSH] 7)  TLB-A1C / Hgb A1C (Glycohemoglobin) [83036-A1C] 8)  TLB-Microalbumin/Creat Ratio, Urine [82043-MALB] 9)  TLB-Udip w/ Micro [81001-URINE] 10)  TLB-PSA (Prostate Specific Antigen) [84153-PSA] 11)  TLB-Testosterone, Total [84403-TESTO] 12)  Est. Patient Level III [70623] 13)  Est. Patient 65& > [76283]

## 2011-03-07 ENCOUNTER — Other Ambulatory Visit: Payer: Self-pay

## 2011-03-08 MED ORDER — TEMAZEPAM 30 MG PO CAPS: 30.0000 mg | ORAL_CAPSULE | Freq: Every evening | ORAL | Status: DC | PRN

## 2011-03-08 NOTE — Telephone Encounter (Signed)
Rx faxed to pharmacy, pt advised of same via VM

## 2011-03-08 NOTE — Telephone Encounter (Signed)
i printed 

## 2011-03-27 ENCOUNTER — Telehealth: Payer: Self-pay

## 2011-03-27 NOTE — Telephone Encounter (Signed)
Best to watch for now.  Ov if fever or rash

## 2011-03-27 NOTE — Telephone Encounter (Signed)
Pt advised.

## 2011-03-27 NOTE — Telephone Encounter (Signed)
Pt called stating he found a tick under his arm. He was able to remove the tick, all except the head. Pt is requesting advisement form MD, does he need OV for removal or wil lit eventually fall off?

## 2011-04-17 NOTE — Op Note (Signed)
NAMEANTON, Derek [name]               ACCOUNT NO.:  [id number]   MEDICAL RECORD NO.:  [id number]          PATIENT TYPE:  AMB   LOCATION:  DAY                          FACILITY:  [facility]   PHYSICIAN:  Jene Every, M.D.    DATE OF BIRTH:  Mar 23, 1941   DATE OF PROCEDURE:  04/08/2008  DATE OF DISCHARGE:                               OPERATIVE REPORT   PREOPERATIVE DIAGNOSIS:  Patellar clunk syndrome, right knee.   POSTOPERATIVE DIAGNOSIS:  Patellar clunk syndrome, right knee.   PROCEDURE PERFORMED:  Right knee arthroscopy, remnant of scar tissue in  the suprapatellar pouch and the infrapatellar pouch, extensive  synovectomy.   BRIEF HISTORY OF THE CASE:  This is a 70 year old status post knee  replacement with patellar clunk syndrome with flexion, presumed to be  scar tissue in the suprapatellar pouch from the infrapatellar region  impacting mechanical gliding, is indicated for arthroscopic debridement,  possible open procedure.  Risks and benefits were discussed including  bleeding, infection, no change in symptoms, worsening of his symptoms  with the need for open debridement.   OPERATIVE TECHNIQUE:  With the patient in supine position, after  induction of adequate general anesthesia and 1 g of Kefzol, the right  lower extremity was prepped and draped in the usual sterile fashion.  A  lateral parapatellar portal, superior medial parapatellar portal, and an  inferior lateral parapatellar portal was fashioned with a #11 blade.  Ingress cannula atraumatically placed.  Irrigant was utilized to  insufflate the joint.  Just prior to that flexion and extension there  was an audible pop and a palpable pop with flexion beyond 60 degrees.  This was felt to be somewhat in the suprapatellar region as in the  infrapatellar region.  I examined these regions under arthroscopy and  there was exuberant hypertrophic scar tissue distally beneath the  patellar tendon with flexion and extension.  This  appeared to be  impingement.  Also there was a large fibrous wad in the suprapatellar  region that impacted with flexion and extension and appeared to be a  plica type band that was impacted across the medial femoral condylar  portion of the total knee.  I placed 18-gauge needles just underneath  the patellar ligament in the quadriceps tendon lateral to medial to  separate that from the underlying scar tissue.  I then used an  ArthroWand cutting to carefully debride these regions, lysed the plica,  and debrided the fibrocartilaginous mass both in the suprapatellar  region and in the infrapatellar region, again taking all precautions to  preserve the patellar ligament and the quadriceps tendon insertion.  There was no evidence that that was compromised.  After debriding these  regions in both flexion and extension I felt no further impingement  either in the suprapatellar region or in the infrapatellar region and no  evidence of a clunk.  This was with full evacuation of the arthroscopic  lavage.  In examining the joint space there was no interposing tissue in  the medial and lateral joint space or around the notch as well.  The  knee was  copiously  lavaged and closed the portals with 4-0 nylon simple sutures.  Quarter  percent Marcaine with epinephrine was infiltrated in the joint.  The  wound was dressed sterilely.  He was awakened without difficulty and  transported to the recovery room in satisfactory condition.  The patient  tolerated the procedure well, no complications.      Jene Every, M.D.  Electronically Signed     JB/MEDQ  D:  04/09/2008  T:  04/09/2008  Job:  [id number]

## 2011-04-20 NOTE — Procedures (Signed)
[facility]  Patient:    Derek [name], Derek [name]                      MRN: [id number] Proc. Date: 09/25/00 Adm. Date:  [id number] Attending:  Rich Brave CC:         Justine Null, M.D. [facility]  [place], [address]  [place] [address]   Procedure Report  PROCEDURE:  Colonoscopy with biopsy.  INDICATION:  Screening for colon cancer in a 70 year old male.  He has no specific risk factors.  He has no GI symptoms.  He had a negative screening colonoscopy five years ago.  FINDINGS:  Diminutive rectal polyp.  Otherwise normal to cecum.  DESCRIPTION OF PROCEDURE:  The nature, purpose, and risks of the procedure wee familiar to the patient, who provided written consent.  Sedation was fentanyl 87.5 mcg and Versed 7 mg IV, without arrhythmias or desaturation.  Digital exam of the prostate was unremarkable.  The Olympus adult video colonoscope was advanced without too much difficulty to the cecum, as identified by clear visualization of the ileocecal valve and typical cecal appearance.  There was a fairly sharp angulation in the sigmoid region, which was more easily negotiated after we turned the patient into the supine position.  The quality of the prep was excellent, and it is felt that all areas were well seen.  This exam was pertinent for a diminutive 2 mm, slightly erythematous polyp at about 15 cm from the external anal opening in the upper rectum, removed by two cold biopsies.  No large polyps, cancer, colitis, vascular malformations, or diverticular disease were observed.  Retroflexion in the rectum prior to biopsy of the above-mentioned polyp was unremarkable.  The patient tolerated the procedure well, and there were no apparent complications.  IMPRESSION:  Diminutive rectal polyp removed by cold biopsy technique.  A small amount of residual polyp tissue may remain.  PLAN:  Await pathology on polyp.  Follow-up  colonoscopy in five years, particularly if the polyp is adenomatous in character. DD:  09/25/00 TD:  09/25/00 Job: [id number]

## 2011-04-20 NOTE — Discharge Summary (Signed)
[name], [name] NO.:  [id number]   MEDICAL RECORD NO.:  [id number]          PATIENT TYPE:  INP   LOCATION:  1512                         FACILITY:  [facility]   PHYSICIAN:  Derek [name], M.D.    DATE OF BIRTH:  Jun 25, 1941   DATE OF ADMISSION:  12/20/2005  DATE OF DISCHARGE:  12/24/2005                                 DISCHARGE SUMMARY   ADMISSION DIAGNOSES:  1.  Degenerative joint disease, right knee.  2.  Hypercholesterolemia.  3.  History of skin cancer.  4.  Gastroesophageal reflux disease.   DISCHARGE DIAGNOSES:  1.  Status post right total knee arthroplasty.  2.  Degenerative joint disease, right knee.  3.  Hypercholesterolemia.  4.  History of skin cancer.  5.  Gastroesophageal reflux disease.   PROCEDURE:  The patient was taken to the OR on December 20, 2005 to undergo a  right total knee arthroplasty. Surgeon, Dr. Jene [name], assistant,  Roma Schanz, PA-C. Anesthesia general, complications none.   CONSULTATIONS:  PT, OT, case management.   HISTORY:  Derek [name] is a pleasant 70 year old gentleman with a long  standing history of bilateral knee pain, right being greater than left. He  initially underwent cortisone injections with some relief of his symptoms  but over time pain has persisted and he has developed some mechanical knee  pain. X-rays did reveal osteoarthritis of the bilateral knees with an MRI  demonstrating tricompartment osteoarthritis. At this point due to the  longevity of the patient's symptoms and failure to progress significantly  with conservative treatment, it was felt he would benefit from right knee  arthroplasty. The risks and benefits of the surgery were discussed with the  patient and he wishes to proceed.   LABORATORY DATA:  Preoperative labs showed a white cell count of 6.7,  hemoglobin of 15.8, hematocrit 45.5. This was followed throughout the  hospital course. The patient did develop leukocytosis  postoperative day #2  with a white cell count of 11.2; however, at the time of discharge,  this  resolved with a white cell count of 10.2, hemoglobin remained stabilized  throughout the hospital course. At the time of discharge, hemoglobin 13.1,  hematocrit 38.8. Coagulation studies done preoperatively showed PT of 12.2,  INR of 0.9. These were followed throughout the hospital course. At the time  of discharge, the patient was therapeutic on this Coumadin with an INR of  2.1. Routine chemistry studies were obtained preoperatively and showed  sodium of 134, potassium 4.0, slightly elevated glucose of 105. These were  followed throughout the hospital course which remained stabilized with a  sodium of 133, potassium 4.3, glucose of 136. His urinalysis was negative.  Blood type is O positive.   Preoperative EKG showed sinus bradycardia. Preoperative chest x-ray showed  no active cardiopulmonary disease.   HOSPITAL COURSE:  The patient was admitted, taken to the OR and underwent  the above stated procedure without difficulty. He was then transferred to  the PACU and then to the orthopedic floor for continued postoperative care.  He was placed on Coumadin postoperatively for DVT  prophylaxis as well as PCA  morphine for pain control. Postoperatively the patient did fairly well. He  did have a Tmax of 100.4 on postoperative day #1. All vital signs remained  stable, slightly elevated white cell count at 10.4. Compartments were soft.  Motor and neurovascular function was intact. PT/OT was consulted for  therapy. The patient continued to do well postoperatively. Hemoglobin  remained stabilized. Temperature normalized. He continued to progress with  his therapy as well as became therapeutic on his Coumadin. Postoperative day  #4, the patient was doing very well, pain was well controlled on p.o.  analgesics. Vital signs were stable, he was afebrile. White cell count  normalized. Hemoglobin was stable  with a hemoglobin of 13.1, hematocrit  38.8. Incision was clean and dry without evidence of infection. Motor and  neurovascular function intact, calves were soft, nontender. It was felt at  this point, the patient was safe to be discharged home with home health  PT/OT as well as monitoring of his Coumadin.   FOLLOW UP:  He should followup with Dr. Shelle Iron in approximately 10-14 days,  he is to call the office for an appointment.   WOUND CARE:  He is to change his dressing daily or until no drainage. Okay  for him to shower.   Increase activity slowly, utilize a walker when ambulating. No driving for 2  weeks. He is to elevate his right lower extremity 6 times a day for 20  minutes at a time. He is to continue to use his incentive spirometer at home  several times daily.   DISCHARGE MEDICATIONS:  1.  Vicodin 1-2 p.o. q.4-6 p.r.n. severe pain.  2.  Coumadin 5 mg daily or as directed by home health depending on our      results.  3.  Robaxin 1 p.o. q.6-8 p.r.n. spasm.  4.  Ambien 10 mg 1 p.o. q.h.s. p.r.n.   DIET:  Advance as tolerated.   CONDITION ON DISCHARGE:  Stable.   FINAL DIAGNOSES:  Doing well status post right total knee arthroplasty.      Roma Schanz, P.A.      Derek [name], M.D.  Electronically Signed    CS/MEDQ  D:  02/05/2006  T:  02/06/2006  Job:  [id number]

## 2011-04-20 NOTE — H&P (Signed)
NAMEESTEVAN, KERSH NO.:  [id number]   MEDICAL RECORD NO.:  [id number]           PATIENT TYPE:   LOCATION:                                 FACILITY:   PHYSICIAN:  Jene Every, M.D.         DATE OF BIRTH:   DATE OF ADMISSION:  12/20/2005  DATE OF DISCHARGE:                                HISTORY & PHYSICAL   CHIEF COMPLAINT:  Right knee pain.   HISTORY OF PRESENT ILLNESS:  The patient is well known to our practice.  He  has been a longstanding patient with complaints of bilateral knee pain.  We  have treated him conservatively with cortisone injections which initially  did give him some relief but over time has persistent pain with development  of mechanical knee symptoms.  X-rays did show osteoarthritis of bilateral  knees.  MRI of the left knee in 2002 indicated posterior horn tear of the  medial meniscus as well as tricompartmental osteophyte formation.  It was  felt, due to the longevity of the patient's symptoms and disabling knee pain  that has not responded to conservative treatment, he would benefit from a  total knee angioplasty.  The risks and benefits of the surgery were  discussed with the patient, and he wished to proceed with a right total knee  replacement.   PAST MEDICAL HISTORY:  Significant for:  1.  Hypercholesterolemia.  2.  History of skin cancer.  3.  Gastroesophageal reflux disease.   CURRENT MEDICATIONS:  1.  Protonix 40 mg daily.  2.  Zetia 10 mg daily.  3.  Tramadol 2 p.o. twice daily p.r.n. pain.  4.  Crestor 40 mg daily.  5.  Multivitamins daily.   ALLERGIES:  None.   PAST SURGICAL HISTORY:  Bilateral cataracts in 2006.   SOCIAL HISTORY:  The patient is married.  He denies any tobacco or alcohol  consumption.  He has four children and lives in a 2-story home with 13  steps.  Primary care physician is Dr. Everardo All at [facility].   FAMILY HISTORY:  Coronary artery disease on maternal side.  Father has  history of prostate  cancer.   REVIEW OF SYSTEMS:  The patient denies any fevers, chills, night sweats, or  bleeding tendency. CNS: No blurred vision, double vision, seizure, headache,  or paralysis. RESPIRATORY: No shortness of breath, productive cough, or  hemoptysis. CARDIOVASCULAR: No angina or orthopnea. GU: No history of  hematuria, dysuria, discharge. GI: No nausea, vomiting, diarrhea,  constipation, bloody stools.  MUSCULOSKELETAL: As per HPI.   PHYSICAL EXAMINATION:  VITAL SIGNS: Pulse 70, respiratory rate 18, blood  pressure 140/90.  GENERAL:  Well-developed, well-nourished gentleman who does walk with slight  antalgic gait.  HEENT: Atraumatic and normocephalic.  Pupils equal, round, and reactive to  light.  EOMs intact.  NECK:  Supple, no lymphadenopathy.  CHEST: Clear to auscultation bilaterally.  No rhonchi, wheezes, rales.  BREASTS/GU: Not examined, not pertinent to HPI.  HEART: Regular rate and rhythm without murmurs, gallops, or rubs.  ABDOMEN: Soft, nontender, nondistended. Bowel sounds x4.  SKIN: The patient has a healing biopsy of the left shoulder, questionable  history of basal cell carcinoma. There is no evidence of infection.  EXTREMITIES: There is mild diffusion noted at the right knee. He is tender  to palpation along the medial joint line as well pain __________  compression.   X-rays did show significant degenerative joint disease of the right knee.   PLAN:  The patient is admitted to [facility] to undergo right  total knee angioplasty.      Roma Schanz, P.A.      Jene Every, M.D.  Electronically Signed    CS/MEDQ  D:  12/17/2005  T:  12/17/2005  Job:  [id number]   cc:   Gregary Signs A. Everardo All, M.D. [facility]  [address]  [place]  [place] [address]

## 2011-04-20 NOTE — Op Note (Signed)
NAMEKADE, DEMICCO NO.:  [id number]   MEDICAL RECORD NO.:  [id number]          PATIENT TYPE:  INP   LOCATION:  X002                         FACILITY:  [facility]   PHYSICIAN:  Jene Every, M.D.    DATE OF BIRTH:  [birth year]   DATE OF PROCEDURE:  12/20/2005  DATE OF DISCHARGE:                                 OPERATIVE REPORT   PREOPERATIVE DIAGNOSIS:  Degenerative joint disease of right knee with a  varus deformity.   POSTOPERATIVE DIAGNOSIS:  Degenerative joint disease of right knee with a  varus deformity.   PROCEDURE PERFORMED:  Right total knee arthroplasty utilizing the T rotating  platform for tibia, for femur, 10 mm insert, 38 patella.   ANESTHESIA:  General.   ASSISTANT:  Roma Schanz, P.A.   BRIEF HISTORY:  The patient is a 70 year old male with refractory knee pain  and varus deformity.  Despite conservative treatment, he had disabling  symptoms requiring a knee replacement.  The risks and benefits were  discussed including bleeding, infection, suboptimal range of motion,  osteofibrosis, component failure, need for revision, etc.   TECHNIQUE:  With the patient in supine position and after the induction of  adequate general anesthesia, all of the right lower extremity was prepped,  draped, and exsanguinated in the usual sterile fashion.  Thigh tourniquet  inflated to 350 mmHg.  Midline incision over the anterior aspect of the knee  was performed.  Subcutaneous tissue was dissected sharply.  Electrocautery  was utilized to maintain hemostasis.  A median parapatellar arthrotomy was  performed.  Synovial fluid was normal and evacuated.  The patella was  everted.  Patellofemoral ligament divided.  We released the medial meniscus  and the deep part of the collateral ligaments posteriorly.  Corrected the  varus deformity and did a soft tissue release.  The knee was then flexed.  The ACL was removed.  Severe tricompartmental arthrosis was noted.  Osteophytes removed with a rongeur.  Set drill utilized in the central  femoral canal, irrigated.  V-shaped reamer, 5 degree less, inserted.  Eleven  mm was selected due to slight flexion contracture.  Distal femoral cut was  performed with an oscillating saw, protecting the soft tissue.  Next, a  sizer was placed with anterior reference.  Utilizing the DePuy system, a 4  was chosen.  Clamped and pinned in the appropriate external rotation.  The  anterior and the posterior cuts were then performed.  Soft tissue protected  at all times with retractors.  The chamfer cuts were then performed as well.  We placed a 4 box guide and performed the box cut, dissecting this  posteriorly.  Next, attention was turned towards the tibia.  It was  subluxed.  We removed residual remnants of the medial and lateral meniscus.  The PCL, the tibial eminence, and posterior osteophytes were removed and  multiple loose bodies posteriorly were removed as well.  We used an external  alignment guide with reference 10 mm off the lateral high side medial to the  tibial tubercle.  The guide was along the second ray just  above the  malleoli.  The cut was perpendicular to the axis of the tibia.  Clamped.  Oscillating saw was utilized to perform the tibial cut.  Soft tissues  protected at all times.  We then checked our flexion and extension gap.  It  was slightly tight in both flexion and extension.  Therefore, we removed 2  mm more off of the tibia, replacing the jig.  This was retrialed and found  to be satisfactory with the trials.  Next, we then prepared the tibia,  subluxed it forward, placed a 4 tray with optimal bony coverage on the  central reaming and the punch pin guide.  This was then removed.  Then, we  placed a trial femur and tibia, 10 mm insert.  We got excellent full  extension, full flexion, no lift off, good stability with varus and valgus  stressing at 0 and 30 degrees and full flexion.  There was no  drawer or spin  out.  Next, we sized the patella to a 38, measured a thickness of 26.  Selected a 9 and utilized the finding reamer to remove 9 mm of the patella.  Following this, we drilled our peg holes.  Trialed the patella and it was  satisfactory.  With this trial, good tracking of the patella was noted  without release required.  Next, all instrumentation was then removed.  We  selected those components and copiously irrigated with pulsatile lavage.  We  checked posteriorly and removed any residual osteophytes or loose debris.  Electrocautery was utilized to cauterize the geniculate.  The knee was then  flexed, the tibia subluxed, dried.  Cement was mixed in an appropriate  fashion and injected into the tibia.  We impacted the tibial component onto  the tibia.  Residual cement removed and placed on the trial insert.  We  placed cement on the femoral component after it was dried and impacted the  femoral component onto the femur.  Residual cement was removed.  It was  reduced and axial load applied into full extension.  This was held with an  axial load until the cement cured.  Residual cement removed.  We clamped the  patella on as well and removed residual cement after appropriately curing of  the cement.  Removed the clamps and the trial.  Removed residual cement.  We  checked it and it was again stable with a 10.  We put a 10 insert into it  and impacted it into place with the soft tissue interposed.  Full extension,  full flexion, and good stability with varus and valgus stressing at 0 and 30  degrees.  No pull out or spin out on full flexion.  There was excellent  tracking of the patella.  In 30 degrees of flexion, we repaired the patella  arthrotomy with #1 Vicryl interrupted figure-of-8 sutures after we placed a  Hemovac and brought it out through a lateral stab wound in the skin.  The  knee was copiously irrigated once again.  Subcutaneous tissue was reapproximated with 2-0  Vicryl simple sutures.  The skin was reapproximated  with staples.  The wound was dressed sterilely.  Sterile dressing applied  and secured with an Ace bandage.  The tourniquet was deflated and adequate  revascularization of the lower extremity appreciated.   The patient tolerated the procedure well with no complications.   TOURNIQUET TIME:  1 hour and 30 minutes.      Jene Every, M.D.  Electronically Signed  JB/MEDQ  D:  12/20/2005  T:  12/20/2005  Job:  [id number]

## 2011-08-10 DIAGNOSIS — E119 Type 2 diabetes mellitus without complications: Secondary | ICD-10-CM

## 2011-08-17 ENCOUNTER — Ambulatory Visit: Payer: PRIVATE HEALTH INSURANCE | Admitting: Endocrinology

## 2011-08-29 ENCOUNTER — Other Ambulatory Visit: Payer: Self-pay

## 2011-08-29 MED ORDER — TEMAZEPAM 15 MG PO CAPS: 15.0000 mg | ORAL_CAPSULE | Freq: Every evening | ORAL | Status: DC | PRN

## 2011-08-29 NOTE — Telephone Encounter (Signed)
Rx faxed to pharmacy, pt informed.  

## 2011-10-11 ENCOUNTER — Encounter: Payer: Self-pay | Admitting: Internal Medicine

## 2011-10-31 ENCOUNTER — Other Ambulatory Visit: Payer: Self-pay

## 2011-10-31 MED ORDER — ROSUVASTATIN CALCIUM 40 MG PO TABS: 40.0000 mg | ORAL_TABLET | Freq: Every day | ORAL | Status: DC

## 2011-10-31 MED ORDER — METFORMIN HCL ER 500 MG PO TB24: 1000.0000 mg | ORAL_TABLET | Freq: Every day | ORAL | Status: DC

## 2011-11-01 MED ORDER — TEMAZEPAM 15 MG PO CAPS: 15.0000 mg | ORAL_CAPSULE | Freq: Every evening | ORAL | Status: DC | PRN

## 2011-11-01 NOTE — Telephone Encounter (Signed)
Rx for Temazepam faxed to [facility] in [place].

## 2011-11-16 ENCOUNTER — Other Ambulatory Visit: Payer: Self-pay | Admitting: *Deleted

## 2011-11-16 NOTE — Telephone Encounter (Signed)
R'cd fax from [facility] for refill of Temazepam   Last OV-02/16/2011  Last written 10/31/2011 #30 with 1 refill

## 2011-11-19 MED ORDER — TEMAZEPAM 15 MG PO CAPS: 15.0000 mg | ORAL_CAPSULE | Freq: Every evening | ORAL | Status: DC | PRN

## 2011-11-19 NOTE — Telephone Encounter (Signed)
i printed 

## 2011-11-19 NOTE — Telephone Encounter (Signed)
Rx faxed to [facility].  

## 2012-01-31 DIAGNOSIS — Z85828 Personal history of other malignant neoplasm of skin: Secondary | ICD-10-CM | POA: Diagnosis not present

## 2012-01-31 DIAGNOSIS — L570 Actinic keratosis: Secondary | ICD-10-CM | POA: Diagnosis not present

## 2012-01-31 DIAGNOSIS — D235 Other benign neoplasm of skin of trunk: Secondary | ICD-10-CM | POA: Diagnosis not present

## 2012-02-18 ENCOUNTER — Other Ambulatory Visit: Payer: Self-pay

## 2012-02-18 MED ORDER — TEMAZEPAM 15 MG PO CAPS: 15.0000 mg | ORAL_CAPSULE | Freq: Every evening | ORAL | Status: DC | PRN

## 2012-02-18 NOTE — Telephone Encounter (Signed)
Rx faxed to pharmacy, pt informed.  

## 2012-02-18 NOTE — Telephone Encounter (Signed)
Per pt -request 6 mth (maxiumum) refill for medication, okayed by Dr Everardo All. Original Rx for #30 x 1 destroyed.

## 2012-02-18 NOTE — Telephone Encounter (Signed)
Addended by: Anselm Jungling on: 02/18/2012 01:36 PM   Modules accepted: Orders

## 2012-03-05 ENCOUNTER — Other Ambulatory Visit: Payer: Self-pay | Admitting: Endocrinology

## 2012-03-22 DIAGNOSIS — J069 Acute upper respiratory infection, unspecified: Secondary | ICD-10-CM | POA: Diagnosis not present

## 2012-04-14 ENCOUNTER — Other Ambulatory Visit: Payer: Self-pay | Admitting: Endocrinology

## 2012-05-16 DIAGNOSIS — M1710 Unilateral primary osteoarthritis, unspecified knee: Secondary | ICD-10-CM | POA: Diagnosis not present

## 2012-08-20 ENCOUNTER — Other Ambulatory Visit: Payer: Self-pay | Admitting: Endocrinology

## 2012-08-21 ENCOUNTER — Encounter: Payer: Medicare Other | Admitting: Endocrinology

## 2012-08-21 ENCOUNTER — Encounter: Payer: Self-pay | Admitting: Internal Medicine

## 2012-08-25 ENCOUNTER — Encounter: Payer: Self-pay | Admitting: Gastroenterology

## 2012-08-26 ENCOUNTER — Other Ambulatory Visit: Payer: Self-pay | Admitting: *Deleted

## 2012-08-26 MED ORDER — TEMAZEPAM 15 MG PO CAPS: 15.0000 mg | ORAL_CAPSULE | Freq: Every evening | ORAL | Status: DC | PRN

## 2012-08-26 NOTE — Telephone Encounter (Signed)
Rx faxed to [facility].  

## 2012-08-26 NOTE — Telephone Encounter (Signed)
i printed 

## 2012-08-26 NOTE — Telephone Encounter (Signed)
R'cd fax from [facility] in [place] for refill of Temazepam-last written 02/18/2012 #30 with 5 refills-please advise.

## 2012-09-09 ENCOUNTER — Encounter: Payer: Self-pay | Admitting: Endocrinology

## 2012-09-09 ENCOUNTER — Encounter: Payer: Medicare Other | Admitting: Endocrinology

## 2012-09-09 ENCOUNTER — Ambulatory Visit (INDEPENDENT_AMBULATORY_CARE_PROVIDER_SITE_OTHER): Payer: Medicare Other | Admitting: Endocrinology

## 2012-09-09 VITALS — BP 136/72 | HR 76 | Temp 98.1°F | Wt 172.0 lb

## 2012-09-09 DIAGNOSIS — Z79899 Other long term (current) drug therapy: Secondary | ICD-10-CM

## 2012-09-09 DIAGNOSIS — I10 Essential (primary) hypertension: Secondary | ICD-10-CM

## 2012-09-09 DIAGNOSIS — E7800 Pure hypercholesterolemia, unspecified: Secondary | ICD-10-CM | POA: Diagnosis not present

## 2012-09-09 DIAGNOSIS — E119 Type 2 diabetes mellitus without complications: Secondary | ICD-10-CM

## 2012-09-09 DIAGNOSIS — Z0000 Encounter for general adult medical examination without abnormal findings: Secondary | ICD-10-CM

## 2012-09-09 DIAGNOSIS — Z125 Encounter for screening for malignant neoplasm of prostate: Secondary | ICD-10-CM | POA: Diagnosis not present

## 2012-09-09 DIAGNOSIS — M1710 Unilateral primary osteoarthritis, unspecified knee: Secondary | ICD-10-CM | POA: Diagnosis not present

## 2012-09-09 DIAGNOSIS — K222 Esophageal obstruction: Secondary | ICD-10-CM

## 2012-09-09 LAB — CBC WITH DIFFERENTIAL/PLATELET
Eosinophils Absolute: 0.3 10*3/uL (ref 0.0–0.7)
Eosinophils Relative: 4 % (ref 0–5)
HCT: 45.1 % (ref 39.0–52.0)
Hemoglobin: 15.8 g/dL (ref 13.0–17.0)
Lymphocytes Relative: 33 % (ref 12–46)
Lymphs Abs: 3 10*3/uL (ref 0.7–4.0)
MCH: 30 pg (ref 26.0–34.0)
MCV: 85.7 fL (ref 78.0–100.0)
Monocytes Absolute: 0.8 10*3/uL (ref 0.1–1.0)
Monocytes Relative: 8 % (ref 3–12)
RBC: 5.26 MIL/uL (ref 4.22–5.81)
WBC: 9.3 10*3/uL (ref 4.0–10.5)

## 2012-09-09 LAB — BASIC METABOLIC PANEL
BUN: 23 mg/dL (ref 6–23)
CO2: 29 mEq/L (ref 19–32)
Calcium: 10 mg/dL (ref 8.4–10.5)
Glucose, Bld: 94 mg/dL (ref 70–99)
Sodium: 140 mEq/L (ref 135–145)

## 2012-09-09 LAB — HEPATIC FUNCTION PANEL
Bilirubin, Direct: 0.2 mg/dL (ref 0.0–0.3)
Indirect Bilirubin: 0.7 mg/dL (ref 0.0–0.9)
Total Bilirubin: 0.9 mg/dL (ref 0.3–1.2)

## 2012-09-09 LAB — LIPID PANEL
HDL: 40 mg/dL (ref >39)
LDL Cholesterol: 73 mg/dL (ref 0–99)
Total CHOL/HDL Ratio: 3.6 Ratio
Triglycerides: 144 mg/dL (ref <150)

## 2012-09-09 MED ORDER — TEMAZEPAM 30 MG PO CAPS: 30.0000 mg | ORAL_CAPSULE | Freq: Every evening | ORAL | Status: DC | PRN

## 2012-09-09 NOTE — Progress Notes (Signed)
Subjective:    Patient ID: Derek [name], male    DOB: Dec 25, 1940, 71 y.o.   MRN: [id number]  HPI The state of at least three ongoing medical problems is addressed today: DM: no cbg record, but states cbg's are well-controlled.  denies hypoglycemia HTN: he takes meds as rx'ed, and tolerates well Dyslipidemia: denies chest pain Past Medical History  Diagnosis Date  . DIABETES MELLITUS, TYPE II 10/22/2007  . COLONIC POLYPS, HX OF 07/30/2007  . SKIN RASH 05/06/2009  . INSOMNIA 11/10/2008  . Pain in Soft Tissues of Limb 10/24/2007  . OSTEOARTHRITIS 07/30/2007  . 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 ESOPHAGEAL STRICTURE 03/22/2009  . Essential hypertension, benign 01/26/2008  . HYPERCHOLESTEROLEMIA 10/24/2007    Past Surgical History  Procedure Date  . Replacement total knee 2007    RT  . Lower arterial 07/30/2006  . Cataract extraction, bilateral 2002    History   Social History  . Marital Status: Married    Spouse Name: N/A    Number of Children: N/A  . Years of Education: N/A   Occupational History  . Not on file.   Social History Main Topics  . Smoking status: Never Smoker   . Smokeless tobacco: Not on file   Comment: Diet and Activity - Good  . Alcohol Use: Yes     Rare  . Drug Use: No  . Sexually Active:    Other Topics Concern  . Not on file   Social History Narrative  . No narrative on file    Current Outpatient Prescriptions on File Prior to Visit  Medication Sig Dispense Refill  . aspirin 325 MG tablet Take 325 mg by mouth daily.        . CRESTOR 40 MG tablet TAKE ONE TABLET BY MOUTH ONE TIME DAILY  30 tablet  0  . metFORMIN (GLUCOPHAGE-XR) 500 MG 24 hr tablet TAKE TWO TABLETS BY MOUTH EVERY MORNING  30 tablet  0  . temazepam (RESTORIL) 15 MG capsule Take 1 capsule (15 mg total) by mouth at bedtime as needed.  30 capsule  5    Not on File  Family History  Problem Relation Age of Onset  . Cancer Father     Bone, per pt    BP 136/72   Pulse 76  Temp 98.1 F (36.7 C) (Oral)  Wt 172 lb (78.019 kg)       Review of Systems  Respiratory: Negative for shortness of breath.   Gastrointestinal: Negative for anal bleeding.  Genitourinary: Negative for hematuria and difficulty urinating.       Objective:   Physical Exam Pulses: dorsalis pedis intact bilat.   Feet: no deformity.  no ulcer on the feet.  feet are of normal color and temp.  no edema Neuro: sensation is intact to touch on the feet    Lab Results  Component Value Date   WBC 9.3 09/09/2012   HGB 15.8 09/09/2012   HCT 45.1 09/09/2012   PLT 290 09/09/2012   GLUCOSE 94 09/09/2012   CHOL 142 09/09/2012   TRIG 144 09/09/2012   HDL 40 09/09/2012   LDLDIRECT 231.0 02/16/2011   LDLCALC 73 09/09/2012   ALT 19 09/09/2012   AST 18 09/09/2012   NA 140 09/09/2012   K 5.4* 09/09/2012   CL 103 09/09/2012   CREATININE 1.06 09/09/2012   BUN 23 09/09/2012   CO2 29 09/09/2012   TSH 1.403 09/09/2012   PSA 1.60 09/09/2012   HGBA1C 6.5*  09/09/2012   MICROALBUR 2.11* 09/09/2012      Assessment & Plan:  DM, well-controlled HTN, well-controlled Hyperkalemia--we'll follow Dyslipidemia, well-controlled   Subjective:   Patient here for Medicare annual wellness visit and management of other chronic and acute problems.     Risk factors: advanced age    Roster of Physicians Providing Medical Care to Patient:  See "snapshot"   Activities of Daily Living: In your present state of health, do you have any difficulty performing the following activities?:  Preparing food and eating?: No  Bathing yourself: No  Getting dressed: No  Using the toilet: No  Moving around from place to place: No  In the past year have you fallen or had a near fall?: No    Home Safety: Has smoke detector and wears seat belts. Pt declines to says if he has firearms or not. No excess sun exposure.  Diet and Exercise  Current exercise habits: pt says good Dietary issues discussed: pt reports a healthy diet    Depression Screen  Q1: Over the past two weeks, have you felt down, depressed or hopeless? no  Q2: Over the past two weeks, have you felt little interest or pleasure in doing things? no   The following portions of the patient's history were reviewed and updated as appropriate: allergies, current medications, past family history, past medical history, past social history, past surgical history and problem list.  Past Medical History  Diagnosis Date  . DIABETES MELLITUS, TYPE II 10/22/2007  . COLONIC POLYPS, HX OF 07/30/2007  . SKIN RASH 05/06/2009  . INSOMNIA 11/10/2008  . Pain in Soft Tissues of Limb 10/24/2007  . OSTEOARTHRITIS 07/30/2007  . 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 ESOPHAGEAL STRICTURE 03/22/2009  . Essential hypertension, benign 01/26/2008  . HYPERCHOLESTEROLEMIA 10/24/2007    Past Surgical History  Procedure Date  . Replacement total knee 2007    RT  . Lower arterial 07/30/2006  . Cataract extraction, bilateral 2002    History   Social History  . Marital Status: Married    Spouse Name: N/A    Number of Children: N/A  . Years of Education: N/A   Occupational History  . Not on file.   Social History Main Topics  . Smoking status: Never Smoker   . Smokeless tobacco: Not on file   Comment: Diet and Activity - Good  . Alcohol Use: Yes     Rare  . Drug Use: No  . Sexually Active:    Other Topics Concern  . Not on file   Social History Narrative  . No narrative on file    Current Outpatient Prescriptions on File Prior to Visit  Medication Sig Dispense Refill  . aspirin 325 MG tablet Take 325 mg by mouth daily.        . CRESTOR 40 MG tablet TAKE ONE TABLET BY MOUTH ONE TIME DAILY  30 tablet  0  . metFORMIN (GLUCOPHAGE-XR) 500 MG 24 hr tablet TAKE TWO TABLETS BY MOUTH EVERY MORNING  30 tablet  0  . temazepam (RESTORIL) 15 MG capsule Take 1 capsule (15 mg total) by mouth at bedtime as needed.  30 capsule  5  . DISCONTD: temazepam (RESTORIL) 30 MG  capsule Take 1 capsule (30 mg total) by mouth at bedtime as needed for sleep.  30 capsule  5    Not on File  Family History  Problem Relation Age of Onset  . Cancer Father     Bone, per pt  BP 136/72  Pulse 76  Temp 98.1 F (36.7 C) (Oral)  Wt 172 lb (78.019 kg)   Review of Systems  Denies hearing loss, and visual loss Objective:   Vision:  Sees opthalmologist Hearing: grossly normal Body mass index:  See vs page Msk: pt easily and quickly performs "get-up-and-go" from a sitting position Cognitive Impairment Assessment: cognition, memory and judgment appear normal.  remembers 3/3 at 5 minutes.  excellent recall.  can easily read and write a sentence.  alert and oriented x 3   Assessment:   Medicare wellness utd on preventive parameters    Plan:   During the course of the visit the patient was educated and counseled about appropriate screening and preventive services including:        Fall prevention    Diabetes screening  Nutrition counseling   Vaccines / LABS Zostavax / Pnemonccoal Vaccine  today  PSA  Patient Instructions (the written plan) was given to the patient.

## 2012-09-09 NOTE — Patient Instructions (Addendum)
Increase the temazepam back to 30 mg.  Here is a prescription. please consider these measures for your health:  minimize alcohol.  do not use tobacco products.  have a colonoscopy at least every 10 years from age 71.  keep firearms safely stored.  always use seat belts.  have working smoke alarms in your home.  see an eye doctor and dentist regularly.  never drive under the influence of alcohol or drugs (including prescription drugs).  those with fair skin should take precautions against the sun. please let me know what your wishes would be, if artificial life support measures should become necessary.  it is critically important to prevent falling down (keep floor areas well-lit, dry, and free of loose objects.  If you have a cane, walker, or wheelchair, you should use it, even for short trips around the house.  Also, try not to rush).  You should have a vaccine against shingles (a painful rash which results from the  chickenpox infection which most people had many years ago).  This vaccine reduces, but does not totally eliminate the risk of shingles.  Because this is a medicare part d benefit, you should get it at a pharmacy.   Please come back for a follow-up appointment in 6 months.  Please call when you are ready to go ahead with your colonoscopy, and we'll be happy to schedule for you.  Robitussin will help you raise sputum. (update: we discussed code status.  pt requests full code, but would not want to be started or maintained on artificial life-support measures if there was not a reasonable chance of recovery).

## 2012-09-10 LAB — URINALYSIS, ROUTINE W REFLEX MICROSCOPIC
Bilirubin Urine: NEGATIVE
Glucose, UA: NEGATIVE mg/dL
Ketones, ur: NEGATIVE mg/dL
Nitrite: NEGATIVE
Specific Gravity, Urine: 1.02 (ref 1.005–1.030)
pH: 6 (ref 5.0–8.0)

## 2012-09-10 LAB — PSA, MEDICARE: PSA: 1.6 ng/mL (ref <=4.00)

## 2012-10-02 ENCOUNTER — Encounter: Payer: Self-pay | Admitting: Endocrinology

## 2012-11-03 ENCOUNTER — Other Ambulatory Visit: Payer: Self-pay | Admitting: Endocrinology

## 2012-11-03 DIAGNOSIS — R1031 Right lower quadrant pain: Secondary | ICD-10-CM | POA: Diagnosis not present

## 2012-11-03 DIAGNOSIS — K5730 Diverticulosis of large intestine without perforation or abscess without bleeding: Secondary | ICD-10-CM | POA: Diagnosis not present

## 2012-11-03 DIAGNOSIS — R109 Unspecified abdominal pain: Secondary | ICD-10-CM | POA: Diagnosis not present

## 2012-11-03 DIAGNOSIS — N1330 Unspecified hydronephrosis: Secondary | ICD-10-CM | POA: Diagnosis not present

## 2012-11-03 DIAGNOSIS — R1084 Generalized abdominal pain: Secondary | ICD-10-CM | POA: Diagnosis not present

## 2012-11-04 ENCOUNTER — Encounter: Payer: Self-pay | Admitting: Gastroenterology

## 2012-11-05 DIAGNOSIS — D485 Neoplasm of uncertain behavior of skin: Secondary | ICD-10-CM | POA: Diagnosis not present

## 2012-11-22 DIAGNOSIS — N23 Unspecified renal colic: Secondary | ICD-10-CM | POA: Diagnosis not present

## 2012-12-02 ENCOUNTER — Ambulatory Visit (AMBULATORY_SURGERY_CENTER): Payer: Medicare Other

## 2012-12-02 VITALS — Ht 67.0 in | Wt 164.4 lb

## 2012-12-02 DIAGNOSIS — Z1211 Encounter for screening for malignant neoplasm of colon: Secondary | ICD-10-CM

## 2012-12-02 DIAGNOSIS — Z86010 Personal history of colonic polyps: Secondary | ICD-10-CM

## 2012-12-02 MED ORDER — MOVIPREP 100 G PO SOLR: ORAL | Status: DC

## 2012-12-19 ENCOUNTER — Encounter: Payer: Self-pay | Admitting: Gastroenterology

## 2012-12-19 ENCOUNTER — Ambulatory Visit (AMBULATORY_SURGERY_CENTER): Payer: Medicare Other | Admitting: Gastroenterology

## 2012-12-19 VITALS — BP 122/74 | HR 62 | Temp 98.8°F | Resp 21 | Ht 66.0 in | Wt 164.0 lb

## 2012-12-19 DIAGNOSIS — Z1211 Encounter for screening for malignant neoplasm of colon: Secondary | ICD-10-CM

## 2012-12-19 DIAGNOSIS — Z860100 Personal history of colon polyps, unspecified: Secondary | ICD-10-CM

## 2012-12-19 DIAGNOSIS — K5730 Diverticulosis of large intestine without perforation or abscess without bleeding: Secondary | ICD-10-CM

## 2012-12-19 DIAGNOSIS — D126 Benign neoplasm of colon, unspecified: Secondary | ICD-10-CM

## 2012-12-19 MED ORDER — SODIUM CHLORIDE 0.9 % IV SOLN: 500.0000 mL | INTRAVENOUS | Status: DC

## 2012-12-19 NOTE — Progress Notes (Signed)
The pt asked if he could watch the colonoscopy. Dr. Christella Hartigan said yes.  The pt experienced cramping as the scope advanced through the sigmoid colon.  I asked the pt if he wanted more sedation, he said, "yes".  More medications were titrated.  The pt woke up as the scope was being withdrawn.  He tolerated the exam well. Maw

## 2012-12-19 NOTE — Patient Instructions (Signed)
YOU HAD AN ENDOSCOPIC PROCEDURE TODAY AT THE [facility]: Refer to the procedure report that was given to you for any specific questions about what was found during the examination.  If the procedure report does not answer your questions, please call your gastroenterologist to clarify.  If you requested that your care partner not be given the details of your procedure findings, then the procedure report has been included in a sealed envelope for you to review at your convenience later.  YOU SHOULD EXPECT: Some feelings of bloating in the abdomen. Passage of more gas than usual.  Walking can help get rid of the air that was put into your GI tract during the procedure and reduce the bloating. If you had a lower endoscopy (such as a colonoscopy or flexible sigmoidoscopy) you may notice spotting of blood in your stool or on the toilet paper. If you underwent a bowel prep for your procedure, then you may not have a normal bowel movement for a few days.  DIET: Your first meal following the procedure should be a light meal and then it is ok to progress to your normal diet.  A half-sandwich or bowl of soup is an example of a good first meal.  Heavy or fried foods are harder to digest and may make you feel nauseous or bloated.  Likewise meals heavy in dairy and vegetables can cause extra gas to form and this can also increase the bloating.  Drink plenty of fluids but you should avoid alcoholic beverages for 24 hours.  ACTIVITY: Your care partner should take you home directly after the procedure.  You should plan to take it easy, moving slowly for the rest of the day.  You can resume normal activity the day after the procedure however you should NOT DRIVE or use heavy machinery for 24 hours (because of the sedation medicines used during the test).    SYMPTOMS TO REPORT IMMEDIATELY: A gastroenterologist can be reached at any hour.  During normal business hours, 8:30 AM to 5:00 PM Monday through Friday,  call [phone].  After hours and on weekends, please call the GI answering service at [phone] who will take a message and have the physician on call contact you.   Following lower endoscopy (colonoscopy or flexible sigmoidoscopy):  Excessive amounts of blood in the stool  Significant tenderness or worsening of abdominal pains  Swelling of the abdomen that is new, acute  Fever of 100F or higher   FOLLOW UP: If any biopsies were taken you will be contacted by phone or by letter within the next 1-3 weeks.  Call your gastroenterologist if you have not heard about the biopsies in 3 weeks.  Our staff will call the home number listed on your records the next business day following your procedure to check on you and address any questions or concerns that you may have at that time regarding the information given to you following your procedure. This is a courtesy call and so if there is no answer at the home number and we have not heard from you through the emergency physician on call, we will assume that you have returned to your regular daily activities without incident.  SIGNATURES/CONFIDENTIALITY: You and/or your care partner have signed paperwork which will be entered into your electronic medical record.  These signatures attest to the fact that that the information above on your After Visit Summary has been reviewed and is understood.  Full responsibility of the confidentiality of   this discharge information lies with you and/or your care-partner.   INFORMATION ON POLYPS GIVEN TO YOU TODAY 

## 2012-12-19 NOTE — Progress Notes (Signed)
Patient did not experience any of the following events: a burn prior to discharge; a fall within the facility; wrong site/side/patient/procedure/implant event; or a hospital transfer or hospital admission upon discharge from the facility. (G8907) Patient did not have preoperative order for IV antibiotic SSI prophylaxis. (G8918)  

## 2012-12-19 NOTE — Op Note (Signed)
[facility] [address] [place], [address]   COLONOSCOPY PROCEDURE REPORT  PATIENT: Derek, [name]  MR#: [id number] BIRTHDATE: [birth year] , 71  yrs. old GENDER: Male ENDOSCOPIST: Rachael Fee, MD PROCEDURE DATE:  12/19/2012 PROCEDURE:   Colonoscopy with snare polypectomy ASA CLASS:   Class III INDICATIONS:adenomatous polyp 2001 ([place]), and 2008 ([place]). MEDICATIONS: Fentanyl 75 mcg IV, Versed 6 mg IV, and These medications were titrated to patient response per physician's verbal order  DESCRIPTION OF PROCEDURE:   After the risks benefits and alternatives of the procedure were thoroughly explained, informed consent was obtained.  A digital rectal exam revealed no abnormalities of the rectum.   The LB [id number]  endoscope was introduced through the anus and advanced to the cecum, which was identified by both the appendix and ileocecal valve. No adverse events experienced.   The quality of the prep was good, using MoviPrep  The instrument was then slowly withdrawn as the colon was fully examined.  COLON FINDINGS: One small sessile polyp was found in ascending colon, removed with cold snare but not retrieved.  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  The examination was otherwise normal.  Retroflexed views revealed no abnormalities. The time to cecum=4 minutes 09 seconds.  Withdrawal time=7 minutes 31 seconds. The scope was withdrawn and the procedure completed. COMPLICATIONS: There were no complications.  ENDOSCOPIC IMPRESSION: One small sessile polyp was found in ascending colon, removed with cold snare but not retrieved.  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  The examination was otherwise normal.  RECOMMENDATIONS: Given your personal history of adenomatous (pre-cancerous) polyps, you will need a repeat colonoscopy in 5 years.   eSigned:  Rachael Fee, MD 12/19/2012 4:11 PM

## 2012-12-22 ENCOUNTER — Telehealth: Payer: Self-pay | Admitting: *Deleted

## 2012-12-22 NOTE — Telephone Encounter (Signed)
Left message on f/u call 

## 2013-01-17 ENCOUNTER — Other Ambulatory Visit: Payer: Self-pay

## 2013-02-18 ENCOUNTER — Other Ambulatory Visit: Payer: Self-pay | Admitting: Endocrinology

## 2013-02-23 ENCOUNTER — Other Ambulatory Visit: Payer: Self-pay | Admitting: Endocrinology

## 2013-02-23 ENCOUNTER — Other Ambulatory Visit: Payer: Self-pay | Admitting: *Deleted

## 2013-02-23 MED ORDER — METFORMIN HCL ER 500 MG PO TB24: 500.0000 mg | ORAL_TABLET | Freq: Every day | ORAL | Status: DC

## 2013-03-17 ENCOUNTER — Other Ambulatory Visit: Payer: Self-pay | Admitting: *Deleted

## 2013-03-18 MED ORDER — TEMAZEPAM 30 MG PO CAPS: 30.0000 mg | ORAL_CAPSULE | Freq: Every evening | ORAL | Status: DC | PRN

## 2013-03-24 DIAGNOSIS — D235 Other benign neoplasm of skin of trunk: Secondary | ICD-10-CM | POA: Diagnosis not present

## 2013-03-24 DIAGNOSIS — D485 Neoplasm of uncertain behavior of skin: Secondary | ICD-10-CM | POA: Diagnosis not present

## 2013-03-24 DIAGNOSIS — L570 Actinic keratosis: Secondary | ICD-10-CM | POA: Diagnosis not present

## 2013-03-24 DIAGNOSIS — L578 Other skin changes due to chronic exposure to nonionizing radiation: Secondary | ICD-10-CM | POA: Diagnosis not present

## 2013-04-17 DIAGNOSIS — G4700 Insomnia, unspecified: Secondary | ICD-10-CM | POA: Diagnosis not present

## 2013-04-17 DIAGNOSIS — Z79899 Other long term (current) drug therapy: Secondary | ICD-10-CM | POA: Diagnosis not present

## 2013-04-17 DIAGNOSIS — G2581 Restless legs syndrome: Secondary | ICD-10-CM | POA: Diagnosis not present

## 2013-04-17 DIAGNOSIS — E7800 Pure hypercholesterolemia, unspecified: Secondary | ICD-10-CM | POA: Diagnosis not present

## 2013-04-17 DIAGNOSIS — R5383 Other fatigue: Secondary | ICD-10-CM | POA: Diagnosis not present

## 2013-04-17 DIAGNOSIS — R42 Dizziness and giddiness: Secondary | ICD-10-CM | POA: Diagnosis not present

## 2013-04-17 DIAGNOSIS — E860 Dehydration: Secondary | ICD-10-CM | POA: Diagnosis not present

## 2013-04-17 DIAGNOSIS — Z96659 Presence of unspecified artificial knee joint: Secondary | ICD-10-CM | POA: Diagnosis not present

## 2013-04-17 DIAGNOSIS — R5381 Other malaise: Secondary | ICD-10-CM | POA: Diagnosis not present

## 2013-04-17 DIAGNOSIS — Z7982 Long term (current) use of aspirin: Secondary | ICD-10-CM | POA: Diagnosis not present

## 2013-04-17 DIAGNOSIS — E119 Type 2 diabetes mellitus without complications: Secondary | ICD-10-CM | POA: Diagnosis not present

## 2013-04-23 ENCOUNTER — Other Ambulatory Visit: Payer: Self-pay | Admitting: *Deleted

## 2013-04-23 ENCOUNTER — Telehealth: Payer: Self-pay

## 2013-04-23 MED ORDER — METFORMIN HCL ER 500 MG PO TB24: 500.0000 mg | ORAL_TABLET | Freq: Every day | ORAL | Status: DC

## 2013-04-23 NOTE — Telephone Encounter (Signed)
Phone call from patient stating he needs a refill on Metformin. I noticed his PCP is not one the physicians in this office. I transferred him to Endocrinology.

## 2013-05-05 DIAGNOSIS — M1710 Unilateral primary osteoarthritis, unspecified knee: Secondary | ICD-10-CM | POA: Diagnosis not present

## 2013-06-08 DIAGNOSIS — M1710 Unilateral primary osteoarthritis, unspecified knee: Secondary | ICD-10-CM | POA: Diagnosis not present

## 2013-06-08 DIAGNOSIS — M25569 Pain in unspecified knee: Secondary | ICD-10-CM | POA: Diagnosis not present

## 2013-06-29 ENCOUNTER — Other Ambulatory Visit: Payer: Self-pay | Admitting: Endocrinology

## 2013-08-31 DIAGNOSIS — M1710 Unilateral primary osteoarthritis, unspecified knee: Secondary | ICD-10-CM | POA: Diagnosis not present

## 2013-08-31 DIAGNOSIS — Z79899 Other long term (current) drug therapy: Secondary | ICD-10-CM | POA: Diagnosis not present

## 2013-08-31 DIAGNOSIS — R29898 Other symptoms and signs involving the musculoskeletal system: Secondary | ICD-10-CM | POA: Diagnosis not present

## 2013-08-31 DIAGNOSIS — Z5181 Encounter for therapeutic drug level monitoring: Secondary | ICD-10-CM | POA: Diagnosis not present

## 2013-08-31 DIAGNOSIS — M25559 Pain in unspecified hip: Secondary | ICD-10-CM | POA: Diagnosis not present

## 2013-08-31 DIAGNOSIS — Z0183 Encounter for blood typing: Secondary | ICD-10-CM | POA: Diagnosis not present

## 2013-08-31 DIAGNOSIS — Z7982 Long term (current) use of aspirin: Secondary | ICD-10-CM | POA: Diagnosis not present

## 2013-08-31 DIAGNOSIS — Z96659 Presence of unspecified artificial knee joint: Secondary | ICD-10-CM | POA: Diagnosis not present

## 2013-08-31 DIAGNOSIS — E119 Type 2 diabetes mellitus without complications: Secondary | ICD-10-CM | POA: Diagnosis not present

## 2013-08-31 DIAGNOSIS — Z8249 Family history of ischemic heart disease and other diseases of the circulatory system: Secondary | ICD-10-CM | POA: Diagnosis not present

## 2013-08-31 DIAGNOSIS — R9431 Abnormal electrocardiogram [ECG] [EKG]: Secondary | ICD-10-CM | POA: Diagnosis not present

## 2013-08-31 DIAGNOSIS — Z01818 Encounter for other preprocedural examination: Secondary | ICD-10-CM | POA: Diagnosis not present

## 2013-09-09 DIAGNOSIS — M1710 Unilateral primary osteoarthritis, unspecified knee: Secondary | ICD-10-CM | POA: Diagnosis not present

## 2013-09-09 DIAGNOSIS — Z7982 Long term (current) use of aspirin: Secondary | ICD-10-CM | POA: Diagnosis not present

## 2013-09-09 DIAGNOSIS — E119 Type 2 diabetes mellitus without complications: Secondary | ICD-10-CM | POA: Diagnosis present

## 2013-09-09 DIAGNOSIS — G8918 Other acute postprocedural pain: Secondary | ICD-10-CM | POA: Diagnosis not present

## 2013-09-09 DIAGNOSIS — E785 Hyperlipidemia, unspecified: Secondary | ICD-10-CM | POA: Diagnosis present

## 2013-09-09 DIAGNOSIS — Z79899 Other long term (current) drug therapy: Secondary | ICD-10-CM | POA: Diagnosis not present

## 2013-09-12 DIAGNOSIS — E785 Hyperlipidemia, unspecified: Secondary | ICD-10-CM | POA: Diagnosis not present

## 2013-09-12 DIAGNOSIS — IMO0001 Reserved for inherently not codable concepts without codable children: Secondary | ICD-10-CM | POA: Diagnosis not present

## 2013-09-12 DIAGNOSIS — Z792 Long term (current) use of antibiotics: Secondary | ICD-10-CM | POA: Diagnosis not present

## 2013-09-12 DIAGNOSIS — Z85828 Personal history of other malignant neoplasm of skin: Secondary | ICD-10-CM | POA: Diagnosis not present

## 2013-09-12 DIAGNOSIS — E119 Type 2 diabetes mellitus without complications: Secondary | ICD-10-CM | POA: Diagnosis not present

## 2013-09-12 DIAGNOSIS — Z9181 History of falling: Secondary | ICD-10-CM | POA: Diagnosis not present

## 2013-09-12 DIAGNOSIS — Z96659 Presence of unspecified artificial knee joint: Secondary | ICD-10-CM | POA: Diagnosis not present

## 2013-09-12 DIAGNOSIS — L089 Local infection of the skin and subcutaneous tissue, unspecified: Secondary | ICD-10-CM | POA: Diagnosis not present

## 2013-09-12 DIAGNOSIS — Z471 Aftercare following joint replacement surgery: Secondary | ICD-10-CM | POA: Diagnosis not present

## 2013-09-12 DIAGNOSIS — Z7982 Long term (current) use of aspirin: Secondary | ICD-10-CM | POA: Diagnosis not present

## 2013-09-14 DIAGNOSIS — R310 Gross hematuria: Secondary | ICD-10-CM | POA: Diagnosis not present

## 2013-09-14 DIAGNOSIS — N139 Obstructive and reflux uropathy, unspecified: Secondary | ICD-10-CM | POA: Diagnosis not present

## 2013-09-14 DIAGNOSIS — N9989 Other postprocedural complications and disorders of genitourinary system: Secondary | ICD-10-CM | POA: Diagnosis not present

## 2013-09-15 DIAGNOSIS — E785 Hyperlipidemia, unspecified: Secondary | ICD-10-CM | POA: Diagnosis not present

## 2013-09-15 DIAGNOSIS — Z96659 Presence of unspecified artificial knee joint: Secondary | ICD-10-CM | POA: Diagnosis not present

## 2013-09-15 DIAGNOSIS — L089 Local infection of the skin and subcutaneous tissue, unspecified: Secondary | ICD-10-CM | POA: Diagnosis not present

## 2013-09-15 DIAGNOSIS — E119 Type 2 diabetes mellitus without complications: Secondary | ICD-10-CM | POA: Diagnosis not present

## 2013-09-15 DIAGNOSIS — Z471 Aftercare following joint replacement surgery: Secondary | ICD-10-CM | POA: Diagnosis not present

## 2013-09-15 DIAGNOSIS — IMO0001 Reserved for inherently not codable concepts without codable children: Secondary | ICD-10-CM | POA: Diagnosis not present

## 2013-09-16 DIAGNOSIS — N139 Obstructive and reflux uropathy, unspecified: Secondary | ICD-10-CM | POA: Diagnosis not present

## 2013-09-17 DIAGNOSIS — E119 Type 2 diabetes mellitus without complications: Secondary | ICD-10-CM | POA: Diagnosis not present

## 2013-09-17 DIAGNOSIS — Z96659 Presence of unspecified artificial knee joint: Secondary | ICD-10-CM | POA: Diagnosis not present

## 2013-09-17 DIAGNOSIS — IMO0001 Reserved for inherently not codable concepts without codable children: Secondary | ICD-10-CM | POA: Diagnosis not present

## 2013-09-17 DIAGNOSIS — E785 Hyperlipidemia, unspecified: Secondary | ICD-10-CM | POA: Diagnosis not present

## 2013-09-17 DIAGNOSIS — Z471 Aftercare following joint replacement surgery: Secondary | ICD-10-CM | POA: Diagnosis not present

## 2013-09-17 DIAGNOSIS — L089 Local infection of the skin and subcutaneous tissue, unspecified: Secondary | ICD-10-CM | POA: Diagnosis not present

## 2013-09-18 DIAGNOSIS — E785 Hyperlipidemia, unspecified: Secondary | ICD-10-CM | POA: Diagnosis not present

## 2013-09-18 DIAGNOSIS — Z471 Aftercare following joint replacement surgery: Secondary | ICD-10-CM | POA: Diagnosis not present

## 2013-09-18 DIAGNOSIS — L089 Local infection of the skin and subcutaneous tissue, unspecified: Secondary | ICD-10-CM | POA: Diagnosis not present

## 2013-09-18 DIAGNOSIS — Z96659 Presence of unspecified artificial knee joint: Secondary | ICD-10-CM | POA: Diagnosis not present

## 2013-09-18 DIAGNOSIS — E119 Type 2 diabetes mellitus without complications: Secondary | ICD-10-CM | POA: Diagnosis not present

## 2013-09-18 DIAGNOSIS — IMO0001 Reserved for inherently not codable concepts without codable children: Secondary | ICD-10-CM | POA: Diagnosis not present

## 2013-09-21 DIAGNOSIS — Z96659 Presence of unspecified artificial knee joint: Secondary | ICD-10-CM | POA: Diagnosis not present

## 2013-09-21 DIAGNOSIS — E785 Hyperlipidemia, unspecified: Secondary | ICD-10-CM | POA: Diagnosis not present

## 2013-09-21 DIAGNOSIS — IMO0001 Reserved for inherently not codable concepts without codable children: Secondary | ICD-10-CM | POA: Diagnosis not present

## 2013-09-21 DIAGNOSIS — Z471 Aftercare following joint replacement surgery: Secondary | ICD-10-CM | POA: Diagnosis not present

## 2013-09-21 DIAGNOSIS — E119 Type 2 diabetes mellitus without complications: Secondary | ICD-10-CM | POA: Diagnosis not present

## 2013-09-21 DIAGNOSIS — L089 Local infection of the skin and subcutaneous tissue, unspecified: Secondary | ICD-10-CM | POA: Diagnosis not present

## 2013-09-22 ENCOUNTER — Other Ambulatory Visit: Payer: Self-pay | Admitting: *Deleted

## 2013-09-22 ENCOUNTER — Other Ambulatory Visit: Payer: Self-pay | Admitting: Endocrinology

## 2013-09-23 ENCOUNTER — Other Ambulatory Visit: Payer: Self-pay

## 2013-09-23 DIAGNOSIS — E119 Type 2 diabetes mellitus without complications: Secondary | ICD-10-CM | POA: Diagnosis not present

## 2013-09-23 DIAGNOSIS — E785 Hyperlipidemia, unspecified: Secondary | ICD-10-CM | POA: Diagnosis not present

## 2013-09-23 DIAGNOSIS — L089 Local infection of the skin and subcutaneous tissue, unspecified: Secondary | ICD-10-CM | POA: Diagnosis not present

## 2013-09-23 DIAGNOSIS — Z96659 Presence of unspecified artificial knee joint: Secondary | ICD-10-CM | POA: Diagnosis not present

## 2013-09-23 DIAGNOSIS — Z471 Aftercare following joint replacement surgery: Secondary | ICD-10-CM | POA: Diagnosis not present

## 2013-09-23 DIAGNOSIS — IMO0001 Reserved for inherently not codable concepts without codable children: Secondary | ICD-10-CM | POA: Diagnosis not present

## 2013-09-23 MED ORDER — TEMAZEPAM 15 MG PO CAPS: 30.0000 mg | ORAL_CAPSULE | Freq: Every evening | ORAL | Status: DC | PRN

## 2013-09-30 DIAGNOSIS — E119 Type 2 diabetes mellitus without complications: Secondary | ICD-10-CM | POA: Diagnosis not present

## 2013-09-30 DIAGNOSIS — IMO0001 Reserved for inherently not codable concepts without codable children: Secondary | ICD-10-CM | POA: Diagnosis not present

## 2013-09-30 DIAGNOSIS — L089 Local infection of the skin and subcutaneous tissue, unspecified: Secondary | ICD-10-CM | POA: Diagnosis not present

## 2013-09-30 DIAGNOSIS — Z96659 Presence of unspecified artificial knee joint: Secondary | ICD-10-CM | POA: Diagnosis not present

## 2013-09-30 DIAGNOSIS — E785 Hyperlipidemia, unspecified: Secondary | ICD-10-CM | POA: Diagnosis not present

## 2013-09-30 DIAGNOSIS — Z471 Aftercare following joint replacement surgery: Secondary | ICD-10-CM | POA: Diagnosis not present

## 2013-10-07 DIAGNOSIS — M25569 Pain in unspecified knee: Secondary | ICD-10-CM | POA: Diagnosis not present

## 2013-10-08 ENCOUNTER — Other Ambulatory Visit: Payer: Self-pay

## 2013-10-08 ENCOUNTER — Telehealth: Payer: Self-pay | Admitting: Endocrinology

## 2013-10-08 MED ORDER — METFORMIN HCL ER 500 MG PO TB24: 500.0000 mg | ORAL_TABLET | Freq: Every day | ORAL | Status: DC

## 2013-10-08 MED ORDER — ROSUVASTATIN CALCIUM 40 MG PO TABS: ORAL_TABLET | ORAL | Status: DC

## 2013-10-08 MED ORDER — TEMAZEPAM 15 MG PO CAPS: 30.0000 mg | ORAL_CAPSULE | Freq: Every evening | ORAL | Status: DC | PRN

## 2013-10-08 MED ORDER — METFORMIN HCL ER 500 MG PO TB24: ORAL_TABLET | ORAL | Status: DC

## 2013-10-08 NOTE — Telephone Encounter (Signed)
Pt advised he needed a cpe , pt given 30 day supply and appt made for 11/17

## 2013-10-09 ENCOUNTER — Other Ambulatory Visit: Payer: Self-pay | Admitting: *Deleted

## 2013-10-09 NOTE — Telephone Encounter (Signed)
Rx already sent into pharmacy.

## 2013-10-12 DIAGNOSIS — M25569 Pain in unspecified knee: Secondary | ICD-10-CM | POA: Diagnosis not present

## 2013-10-14 DIAGNOSIS — M25569 Pain in unspecified knee: Secondary | ICD-10-CM | POA: Diagnosis not present

## 2013-10-19 ENCOUNTER — Ambulatory Visit (INDEPENDENT_AMBULATORY_CARE_PROVIDER_SITE_OTHER): Payer: Medicare Other | Admitting: Endocrinology

## 2013-10-19 ENCOUNTER — Encounter: Payer: Self-pay | Admitting: Endocrinology

## 2013-10-19 VITALS — BP 170/90 | HR 68 | Temp 98.2°F | Resp 16 | Ht 67.0 in | Wt 161.1 lb

## 2013-10-19 DIAGNOSIS — I10 Essential (primary) hypertension: Secondary | ICD-10-CM

## 2013-10-19 DIAGNOSIS — Z0000 Encounter for general adult medical examination without abnormal findings: Secondary | ICD-10-CM | POA: Diagnosis not present

## 2013-10-19 DIAGNOSIS — Z125 Encounter for screening for malignant neoplasm of prostate: Secondary | ICD-10-CM

## 2013-10-19 DIAGNOSIS — E119 Type 2 diabetes mellitus without complications: Secondary | ICD-10-CM | POA: Diagnosis not present

## 2013-10-19 DIAGNOSIS — E7800 Pure hypercholesterolemia, unspecified: Secondary | ICD-10-CM

## 2013-10-19 DIAGNOSIS — Z79899 Other long term (current) drug therapy: Secondary | ICD-10-CM | POA: Diagnosis not present

## 2013-10-19 DIAGNOSIS — M25569 Pain in unspecified knee: Secondary | ICD-10-CM | POA: Diagnosis not present

## 2013-10-19 LAB — URINALYSIS, ROUTINE W REFLEX MICROSCOPIC
Bilirubin Urine: NEGATIVE
Hgb urine dipstick: NEGATIVE
Ketones, ur: NEGATIVE
Leukocytes, UA: NEGATIVE
Specific Gravity, Urine: 1.02 (ref 1.000–1.030)
Urine Glucose: NEGATIVE
Urobilinogen, UA: 0.2 (ref 0.0–1.0)
pH: 6.5 (ref 5.0–8.0)

## 2013-10-19 LAB — CBC WITH DIFFERENTIAL/PLATELET
Basophils Relative: 0.3 % (ref 0.0–3.0)
Eosinophils Relative: 1.8 % (ref 0.0–5.0)
Hemoglobin: 13.3 g/dL (ref 13.0–17.0)
Lymphocytes Relative: 28.3 % (ref 12.0–46.0)
Lymphs Abs: 2.3 10*3/uL (ref 0.7–4.0)
MCV: 86.4 fl (ref 78.0–100.0)
Monocytes Relative: 7.7 % (ref 3.0–12.0)
Neutro Abs: 5 10*3/uL (ref 1.4–7.7)
RBC: 4.6 Mil/uL (ref 4.22–5.81)
RDW: 14.5 % (ref 11.5–14.6)
WBC: 8 10*3/uL (ref 4.5–10.5)

## 2013-10-19 LAB — EKG 12-LEAD

## 2013-10-19 LAB — HEMOGLOBIN A1C: Hgb A1c MFr Bld: 6.3 % (ref 4.6–6.5)

## 2013-10-19 NOTE — Progress Notes (Signed)
Subjective:    Patient ID: Derek [name], male    DOB: [birth year], 72 y.o.   MRN: [id number]  HPI The state of at least three ongoing medical problems is addressed today, with interval history of each noted here: Pt returns for f/u of type 2 DM (dx'ed 2005): no cbg record, but states cbg's are well-controlled.  denies hypoglycemia. Dyslipidemia: he denies chest pain HTN: denies sob Past Medical History  Diagnosis Date  . DIABETES MELLITUS, TYPE II 10/22/2007  . COLONIC POLYPS, HX OF 07/30/2007  . SKIN RASH 05/06/2009  . INSOMNIA 11/10/2008  . Pain in Soft Tissues of Limb 10/24/2007  . OSTEOARTHRITIS 07/30/2007  . 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 ESOPHAGEAL STRICTURE 03/22/2009  . Essential hypertension, benign 01/26/2008  . HYPERCHOLESTEROLEMIA 10/24/2007  . History of kidney stones   . GERD (gastroesophageal reflux disease)     Past Surgical History  Procedure Laterality Date  . Replacement total knee  2007    RT  . Lower arterial  07/30/2006  . Cataract extraction, bilateral  2002    with implants  . Colonoscopy    . Tonsillectomy and adenoidectomy    . Upper gastrointestinal endoscopy      History   Social History  . Marital Status: Widowed    Spouse Name: N/A    Number of Children: N/A  . Years of Education: N/A   Occupational History  . Not on file.   Social History Main Topics  . Smoking status: Never Smoker   . Smokeless tobacco: Never Used     Comment: Diet and Activity - Good  . Alcohol Use: No     Comment: Rare  . Drug Use: No  . Sexual Activity: Not on file   Other Topics Concern  . Not on file   Social History Narrative  . No narrative on file    Current Outpatient Prescriptions on File Prior to Visit  Medication Sig Dispense Refill  . aspirin 325 MG tablet Take 81 mg by mouth daily.       Marland Kitchen aspirin 81 MG tablet Take 81 mg by mouth daily.      Marland Kitchen ibuprofen (ADVIL,MOTRIN) 200 MG tablet Take 200 mg by mouth every 6 (six) hours as  needed.      . metFORMIN (GLUCOPHAGE-XR) 500 MG 24 hr tablet Take 2 tablets daily with breakfast  60 tablet  0  . rosuvastatin (CRESTOR) 40 MG tablet Take one tablet by mouth one time daily  30 tablet  0  . temazepam (RESTORIL) 15 MG capsule Take 2 capsules (30 mg total) by mouth at bedtime as needed.  30 capsule  0   No current facility-administered medications on file prior to visit.    No Known Allergies  Family History  Problem Relation Age of Onset  . Cancer Father     Bone, per pt  . Prostate cancer Father   . Heart disease Mother    BP 170/90  Pulse 68  Temp(Src) 98.2 F (36.8 C) (Oral)  Resp 16  Ht 5\' 7"  (1.702 m)  Wt 161 lb 1.6 oz (73.074 kg)  BMI 25.23 kg/m2  Review of Systems He has lost a few lbs, due to his efforts.  He has slight bilat leg edema.      Objective:   Physical Exam VITAL SIGNS:  See vs page GENERAL: no distress  Lab Results  Component Value Date   WBC 8.0 10/19/2013   HGB 13.3 10/19/2013   HCT  39.8 10/19/2013   PLT 263.0 10/19/2013   GLUCOSE 108* 10/19/2013   CHOL 131 10/19/2013   TRIG 241.0* 10/19/2013   HDL 43.00 10/19/2013   LDLDIRECT 65.9 10/19/2013   LDLCALC 73 09/09/2012   ALT 17 10/19/2013   AST 21 10/19/2013   NA 139 10/19/2013   K 4.8 10/19/2013   CL 106 10/19/2013   CREATININE 1.0 10/19/2013   BUN 15 10/19/2013   CO2 28 10/19/2013   TSH 0.63 10/19/2013   PSA 0.68 10/19/2013   HGBA1C 6.3 10/19/2013   MICROALBUR 1.4 10/19/2013       Assessment & Plan:  DM: well-controlled HTN: possible situational component Dyslipidemia: well-controlled    Subjective:   Patient here for Medicare annual wellness visit and management of other chronic and acute problems.     Risk factors: advanced age    Roster of Physicians Providing Medical Care to Patient:  See "snapshot"   Activities of Daily Living: In your present state of health, do you have any difficulty performing the following activities?:  Preparing food and  eating?: No  Bathing yourself: No  Getting dressed: No  Using the toilet:No  Moving around from place to place: No  In the past year have you fallen or had a near fall?:No    Home Safety: Has smoke detector and wears seat belts. No firearms. No excess sun exposure.  Diet and Exercise  Current exercise habits: pt says good Dietary issues discussed: pt reports a healthy diet   Depression Screen  Q1: Over the past two weeks, have you felt down, depressed or hopeless? no  Q2: Over the past two weeks, have you felt little interest or pleasure in doing things? no   The following portions of the patient's history were reviewed and updated as appropriate: allergies, current medications, past family history, past medical history, past social history, past surgical history and problem list.  Past Medical History  Diagnosis Date  . DIABETES MELLITUS, TYPE II 10/22/2007  . COLONIC POLYPS, HX OF 07/30/2007  . SKIN RASH 05/06/2009  . INSOMNIA 11/10/2008  . Pain in Soft Tissues of Limb 10/24/2007  . OSTEOARTHRITIS 07/30/2007  . 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 ESOPHAGEAL STRICTURE 03/22/2009  . Essential hypertension, benign 01/26/2008  . HYPERCHOLESTEROLEMIA 10/24/2007  . History of kidney stones   . GERD (gastroesophageal reflux disease)     Past Surgical History  Procedure Laterality Date  . Replacement total knee  2007    RT  . Lower arterial  07/30/2006  . Cataract extraction, bilateral  2002    with implants  . Colonoscopy    . Tonsillectomy and adenoidectomy    . Upper gastrointestinal endoscopy      History   Social History  . Marital Status: Widowed    Spouse Name: N/A    Number of Children: N/A  . Years of Education: N/A   Occupational History  . Not on file.   Social History Main Topics  . Smoking status: Never Smoker   . Smokeless tobacco: Never Used     Comment: Diet and Activity - Good  . Alcohol Use: No     Comment: Rare  . Drug Use: No  . Sexual  Activity: Not on file   Other Topics Concern  . Not on file   Social History Narrative  . No narrative on file    Current Outpatient Prescriptions on File Prior to Visit  Medication Sig Dispense Refill  . aspirin 325 MG tablet Take  81 mg by mouth daily.       Marland Kitchen aspirin 81 MG tablet Take 81 mg by mouth daily.      Marland Kitchen ibuprofen (ADVIL,MOTRIN) 200 MG tablet Take 200 mg by mouth every 6 (six) hours as needed.      . metFORMIN (GLUCOPHAGE-XR) 500 MG 24 hr tablet Take 2 tablets daily with breakfast  60 tablet  0  . oxyCODONE (OXYCONTIN) 20 MG 12 hr tablet Take 20 mg by mouth as needed.      . rosuvastatin (CRESTOR) 40 MG tablet Take one tablet by mouth one time daily  30 tablet  0  . temazepam (RESTORIL) 15 MG capsule Take 2 capsules (30 mg total) by mouth at bedtime as needed.  30 capsule  0  . temazepam (RESTORIL) 30 MG capsule Take 1 capsule (30 mg total) by mouth at bedtime as needed for sleep. This replaces the recent prescription for 15 mg  30 capsule  5   No current facility-administered medications on file prior to visit.    No Known Allergies  Family History  Problem Relation Age of Onset  . Cancer Father     Bone, per pt  . Prostate cancer Father   . Heart disease Mother     BP 170/90  Pulse 68  Temp(Src) 98.2 F (36.8 C) (Oral)  Resp 16  Ht 5\' 7"  (1.702 m)  Wt 161 lb 1.6 oz (73.074 kg)  BMI 25.23 kg/m2   Review of Systems  Denies hearing loss, and visual loss Objective:   Vision:  Sees opthalmologist Hearing: grossly normal Body mass index:  See vs page Msk: pt easily and quickly performs "get-up-and-go" from a sitting position. Cognitive Impairment Assessment: cognition, memory and judgment appear normal.  remembers 3/3 at 5 minutes.  excellent recall.  can easily read and write a sentence.  alert and oriented x 3.    Assessment:   Medicare wellness utd on preventive parameters    Plan:   During the course of the visit the patient was educated and  counseled about appropriate screening and preventive services including:        Fall prevention   Diabetes screening  Nutrition counseling   Vaccines are refused today/ LABS are ordered PSA  Patient Instructions (the written plan) was given to the patient.   we discussed code status.  pt requests full code, but would not want to be started or maintained on artificial life-support measures if there was not a reasonable chance of recovery

## 2013-10-19 NOTE — Patient Instructions (Signed)
please consider these measures for your health:  minimize alcohol.  do not use tobacco products.  have a colonoscopy at least every 10 years from age 72.  keep firearms safely stored.  always use seat belts.  have working smoke alarms in your home.  see an eye doctor and dentist regularly.  never drive under the influence of alcohol or drugs (including prescription drugs).  those with fair skin should take precautions against the sun. it is critically important to prevent falling down (keep floor areas well-lit, dry, and free of loose objects.  If you have a cane, walker, or wheelchair, you should use it, even for short trips around the house.  Also, try not to rush). good diet and exercise habits significanly improve the control of your diabetes.  please let me know if you wish to be referred to a dietician.  high blood sugar is very risky to your health.  you should see an eye doctor every year.  You are at higher than average risk for pneumonia and hepatitis-B.  You should be vaccinated against both.   controlling your blood pressure and cholesterol drastically reduces the damage diabetes does to your body.  this also applies to quitting smoking.  please discuss these with your doctor.  Please come back for a follow-up appointment in 6 months blood tests are being requested for you today.  We'll contact you with results.

## 2013-10-20 LAB — HEPATIC FUNCTION PANEL
AST: 21 U/L (ref 0–37)
Albumin: 3.9 g/dL (ref 3.5–5.2)
Alkaline Phosphatase: 50 U/L (ref 39–117)
Total Bilirubin: 0.5 mg/dL (ref 0.3–1.2)
Total Protein: 6.8 g/dL (ref 6.0–8.3)

## 2013-10-20 LAB — LIPID PANEL
Cholesterol: 131 mg/dL (ref 0–200)
Triglycerides: 241 mg/dL — ABNORMAL HIGH (ref 0.0–149.0)
VLDL: 48.2 mg/dL — ABNORMAL HIGH (ref 0.0–40.0)

## 2013-10-20 LAB — BASIC METABOLIC PANEL
BUN: 15 mg/dL (ref 6–23)
GFR: 75.41 mL/min (ref >60.00)
Potassium: 4.8 mEq/L (ref 3.5–5.1)

## 2013-10-20 LAB — LDL CHOLESTEROL, DIRECT: Direct LDL: 65.9 mg/dL

## 2013-10-21 ENCOUNTER — Telehealth: Payer: Self-pay | Admitting: *Deleted

## 2013-10-21 ENCOUNTER — Other Ambulatory Visit: Payer: Self-pay | Admitting: *Deleted

## 2013-10-21 ENCOUNTER — Telehealth: Payer: Self-pay | Admitting: Endocrinology

## 2013-10-21 DIAGNOSIS — M25569 Pain in unspecified knee: Secondary | ICD-10-CM | POA: Diagnosis not present

## 2013-10-21 MED ORDER — TEMAZEPAM 15 MG PO CAPS: 30.0000 mg | ORAL_CAPSULE | Freq: Every evening | ORAL | Status: DC | PRN

## 2013-10-21 NOTE — Telephone Encounter (Signed)
Corrected patient's pharmacy info

## 2013-10-21 NOTE — Telephone Encounter (Signed)
Left message for patient to return call or either to contact his pharmacy to initiate the refill process, as that route is faster.

## 2013-10-26 DIAGNOSIS — M25469 Effusion, unspecified knee: Secondary | ICD-10-CM | POA: Diagnosis not present

## 2013-10-26 DIAGNOSIS — M25569 Pain in unspecified knee: Secondary | ICD-10-CM | POA: Diagnosis not present

## 2013-11-04 DIAGNOSIS — M25569 Pain in unspecified knee: Secondary | ICD-10-CM | POA: Diagnosis not present

## 2013-11-11 DIAGNOSIS — M25569 Pain in unspecified knee: Secondary | ICD-10-CM | POA: Diagnosis not present

## 2013-11-18 ENCOUNTER — Telehealth: Payer: Self-pay

## 2013-11-18 MED ORDER — TEMAZEPAM 15 MG PO CAPS: 30.0000 mg | ORAL_CAPSULE | Freq: Every evening | ORAL | Status: DC | PRN

## 2013-11-18 NOTE — Telephone Encounter (Signed)
Pharmacy requesting refill for Temazepam. Patient was last seen on 10/19/2013 and last filled on 10/21/2013.  Ok to refill?   Thanks!

## 2013-11-18 NOTE — Telephone Encounter (Signed)
i printed 

## 2013-11-19 ENCOUNTER — Other Ambulatory Visit: Payer: Self-pay | Admitting: *Deleted

## 2013-11-19 NOTE — Telephone Encounter (Signed)
This was done yesterday.  

## 2013-11-19 NOTE — Telephone Encounter (Signed)
Script faxed to [facility] .  

## 2013-11-22 ENCOUNTER — Other Ambulatory Visit: Payer: Self-pay | Admitting: Endocrinology

## 2014-01-04 ENCOUNTER — Telehealth: Payer: Self-pay

## 2014-01-04 NOTE — Telephone Encounter (Signed)
Pt called stating that he would like to change the temazepam to a 90 day supply 30 mg 1 time a day. Wanted to check if this change was ok before sending to Dr. Cruzita Lederer.  Please advise, Thanks!

## 2014-01-04 NOTE — Telephone Encounter (Signed)
Ok, thank you, but first please advise the pharmacy to cancel the dec 2014 rx.

## 2014-01-05 NOTE — Telephone Encounter (Signed)
Pharmacy notified and refill canceled.

## 2014-01-13 ENCOUNTER — Telehealth: Payer: Self-pay

## 2014-01-13 MED ORDER — TEMAZEPAM 30 MG PO CAPS: 30.0000 mg | ORAL_CAPSULE | Freq: Every evening | ORAL | Status: DC | PRN

## 2014-01-13 NOTE — Telephone Encounter (Signed)
i printed 

## 2014-01-13 NOTE — Telephone Encounter (Signed)
Pt called requesting a refill for Temazepam. Pt would like to change 30 mg 1 time a day for a 90 day supply. I asked about this last week but forgot to forward while you were out.  Please advise, Thanks!

## 2014-01-14 NOTE — Telephone Encounter (Signed)
Script faxed to [facility] pharmacy and pt notified.

## 2014-01-21 ENCOUNTER — Telehealth: Payer: Self-pay

## 2014-01-21 MED ORDER — RAMELTEON 8 MG PO TABS: 8.0000 mg | ORAL_TABLET | Freq: Every day | ORAL | Status: DC

## 2014-01-21 NOTE — Telephone Encounter (Signed)
Pt called and stated that he would be ok with changing on a trial basis and would like a 90 day supply.  Thanks!

## 2014-01-21 NOTE — Telephone Encounter (Signed)
Ok, i have sent a prescription to your pharmacy  

## 2014-01-21 NOTE — Telephone Encounter (Signed)
Called [facility] [place] [facility] and the representative that I spoke with stated that the coverd alternative for Temazepam was Rozerem.  Please advise, Thanks!

## 2014-01-21 NOTE — Telephone Encounter (Signed)
please call patient: "rozarem" is preferred.  It is a different type of sleep medicine. Options: Pay for the temazepam yourself (it is generic) i can send a prescription for rozarem for you, on a trial basis. Please let me know

## 2014-01-22 NOTE — Telephone Encounter (Signed)
Pt informed

## 2014-02-17 ENCOUNTER — Other Ambulatory Visit: Payer: Self-pay | Admitting: Endocrinology

## 2014-03-05 DIAGNOSIS — R10813 Right lower quadrant abdominal tenderness: Secondary | ICD-10-CM | POA: Diagnosis not present

## 2014-03-24 DIAGNOSIS — L821 Other seborrheic keratosis: Secondary | ICD-10-CM | POA: Diagnosis not present

## 2014-03-24 DIAGNOSIS — L819 Disorder of pigmentation, unspecified: Secondary | ICD-10-CM | POA: Diagnosis not present

## 2014-03-24 DIAGNOSIS — L570 Actinic keratosis: Secondary | ICD-10-CM | POA: Diagnosis not present

## 2014-03-24 DIAGNOSIS — R209 Unspecified disturbances of skin sensation: Secondary | ICD-10-CM | POA: Diagnosis not present

## 2014-04-19 ENCOUNTER — Encounter: Payer: Self-pay | Admitting: Endocrinology

## 2014-04-19 ENCOUNTER — Ambulatory Visit (INDEPENDENT_AMBULATORY_CARE_PROVIDER_SITE_OTHER): Payer: Medicare Other | Admitting: Endocrinology

## 2014-04-19 VITALS — BP 130/72 | HR 54 | Temp 97.9°F | Ht 67.0 in | Wt 171.0 lb

## 2014-04-19 DIAGNOSIS — E119 Type 2 diabetes mellitus without complications: Secondary | ICD-10-CM | POA: Diagnosis not present

## 2014-04-19 DIAGNOSIS — N209 Urinary calculus, unspecified: Secondary | ICD-10-CM | POA: Diagnosis not present

## 2014-04-19 LAB — HEMOGLOBIN A1C: Hgb A1c MFr Bld: 6.9 % — ABNORMAL HIGH (ref 4.6–6.5)

## 2014-04-19 NOTE — Patient Instructions (Addendum)
Please come back for a "medicare wellness" appointment in 6 months.   A diabetes blood test is being requested for you today.  We'll contact you with results.  Please continue the same temazepam.

## 2014-04-19 NOTE — Progress Notes (Signed)
Subjective:    Patient ID: Derek [name], male    DOB: 1941/11/06, 73 y.o.   MRN: [id number]  HPI Pt returns for f/u of type 2 DM (dx'ed 2005; he has mild if any neuropathy of the lower extremities; no known assoc chronic complications; he has never taken insulin): no cbg record, but states cbg's are well-controlled.  denies hypoglycemia.   Pt says rozarem was too expensive, so he went back to temazepam.   Past Medical History  Diagnosis Date  . DIABETES MELLITUS, TYPE II 10/22/2007  . COLONIC POLYPS, HX OF 07/30/2007  . SKIN RASH 05/06/2009  . INSOMNIA 11/10/2008  . Pain in Soft Tissues of Limb 10/24/2007  . OSTEOARTHRITIS 07/30/2007  . 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 ESOPHAGEAL STRICTURE 03/22/2009  . Essential hypertension, benign 01/26/2008  . HYPERCHOLESTEROLEMIA 10/24/2007  . History of kidney stones   . GERD (gastroesophageal reflux disease)     Past Surgical History  Procedure Laterality Date  . Replacement total knee  2007    RT  . Lower arterial  07/30/2006  . Cataract extraction, bilateral  2002    with implants  . Colonoscopy    . Tonsillectomy and adenoidectomy    . Upper gastrointestinal endoscopy      History   Social History  . Marital Status: Widowed    Spouse Name: N/A    Number of Children: N/A  . Years of Education: N/A   Occupational History  . Not on file.   Social History Main Topics  . Smoking status: Never Smoker   . Smokeless tobacco: Never Used     Comment: Diet and Activity - Good  . Alcohol Use: No     Comment: Rare  . Drug Use: No  . Sexual Activity: Not on file   Other Topics Concern  . Not on file   Social History Narrative  . No narrative on file    Current Outpatient Prescriptions on File Prior to Visit  Medication Sig Dispense Refill  . aspirin 325 MG tablet Take 81 mg by mouth daily.       Marland Kitchen aspirin 81 MG tablet Take 81 mg by mouth daily.      . CRESTOR 40 MG tablet TAKE ONE TABLET BY MOUTH ONE TIME DAILY    30 tablet  3  . ibuprofen (ADVIL,MOTRIN) 200 MG tablet Take 200 mg by mouth every 6 (six) hours as needed.      . metFORMIN (GLUCOPHAGE-XR) 500 MG 24 hr tablet TAKE TWO TABLETS BY MOUTH DAILY WITH BREAKFAST   180 tablet  2  . temazepam (RESTORIL) 30 MG capsule Take 1 capsule (30 mg total) by mouth at bedtime as needed for sleep.  90 capsule  1   No current facility-administered medications on file prior to visit.    No Known Allergies  Family History  Problem Relation Age of Onset  . Cancer Father     Bone, per pt  . Prostate cancer Father   . Heart disease Mother     BP 130/72  Pulse 54  Temp(Src) 97.9 F (36.6 C) (Oral)  Ht 5\' 7"  (1.702 m)  Wt 171 lb (77.565 kg)  BMI 26.78 kg/m2  SpO2 96%   Review of Systems Denies weight change    Objective:   Physical Exam VITAL SIGNS:  See vs page GENERAL: no distress Pulses: dorsalis pedis intact bilat.   Feet: no deformity. normal color and temp.  no edema Skin:  no ulcer  on the feet.   Neuro: sensation is intact to touch on the feet   Lab Results  Component Value Date   HGBA1C 6.9* 04/19/2014      Assessment & Plan:  DM: well-controlled. Insomnia: well-controlled.   Patient Instructions  Please come back for a "medicare wellness" appointment in 6 months.   A diabetes blood test is being requested for you today.  We'll contact you with results.  Please continue the same temazepam.

## 2014-05-05 ENCOUNTER — Other Ambulatory Visit: Payer: Self-pay | Admitting: Endocrinology

## 2014-06-11 ENCOUNTER — Telehealth: Payer: Self-pay

## 2014-06-11 MED ORDER — TEMAZEPAM 30 MG PO CAPS: 30.0000 mg | ORAL_CAPSULE | Freq: Every evening | ORAL | Status: DC | PRN

## 2014-06-11 NOTE — Telephone Encounter (Signed)
Rx faxed to pharmacy  

## 2014-06-11 NOTE — Telephone Encounter (Signed)
Received refill request from pharmacy for Temazepam 30 mg. Pt was last seen on 04/19/2014 and medication was last filled on 01/13/2014.  Please advise if ok to refill. Thanks!

## 2014-06-11 NOTE — Telephone Encounter (Signed)
i printed 

## 2014-06-28 ENCOUNTER — Telehealth: Payer: Self-pay | Admitting: Endocrinology

## 2014-06-28 MED ORDER — TEMAZEPAM 30 MG PO CAPS: 30.0000 mg | ORAL_CAPSULE | Freq: Every evening | ORAL | Status: DC | PRN

## 2014-06-28 NOTE — Telephone Encounter (Signed)
[facility] stated that [facility] haven't received his prescription Temazpam 30 mg yet, Please call him and let him know when sent.  Thank you

## 2014-06-28 NOTE — Telephone Encounter (Signed)
[facility]

## 2014-06-28 NOTE — Telephone Encounter (Signed)
rx faxed to pharmacy. Pt advised via voicemail.

## 2014-06-28 NOTE — Telephone Encounter (Signed)
Pt is requesting a refill for Temazepam. [facility] on [facility] 10[facility] of July but pt did not pick rx up ( called pharmacy and verified). Please advise during Dr.Ellison's absence if ok to refill for a 90 day supply with 1 refill. Thanks!

## 2014-09-17 ENCOUNTER — Other Ambulatory Visit: Payer: Self-pay

## 2014-09-27 DIAGNOSIS — M19072 Primary osteoarthritis, left ankle and foot: Secondary | ICD-10-CM | POA: Diagnosis not present

## 2014-09-27 DIAGNOSIS — Z96653 Presence of artificial knee joint, bilateral: Secondary | ICD-10-CM | POA: Diagnosis not present

## 2014-09-27 DIAGNOSIS — M1712 Unilateral primary osteoarthritis, left knee: Secondary | ICD-10-CM | POA: Diagnosis not present

## 2014-09-27 DIAGNOSIS — Z96651 Presence of right artificial knee joint: Secondary | ICD-10-CM | POA: Diagnosis not present

## 2014-09-27 DIAGNOSIS — Z96652 Presence of left artificial knee joint: Secondary | ICD-10-CM | POA: Diagnosis not present

## 2014-09-27 DIAGNOSIS — M25562 Pain in left knee: Secondary | ICD-10-CM | POA: Diagnosis not present

## 2014-11-23 DIAGNOSIS — E784 Other hyperlipidemia: Secondary | ICD-10-CM | POA: Diagnosis not present

## 2014-11-23 DIAGNOSIS — E119 Type 2 diabetes mellitus without complications: Secondary | ICD-10-CM | POA: Diagnosis not present

## 2014-11-23 DIAGNOSIS — J019 Acute sinusitis, unspecified: Secondary | ICD-10-CM | POA: Diagnosis not present

## 2014-11-23 DIAGNOSIS — J209 Acute bronchitis, unspecified: Secondary | ICD-10-CM | POA: Diagnosis not present

## 2014-12-07 DIAGNOSIS — E118 Type 2 diabetes mellitus with unspecified complications: Secondary | ICD-10-CM | POA: Diagnosis not present

## 2014-12-07 DIAGNOSIS — Z96651 Presence of right artificial knee joint: Secondary | ICD-10-CM | POA: Diagnosis not present

## 2014-12-07 DIAGNOSIS — Z8249 Family history of ischemic heart disease and other diseases of the circulatory system: Secondary | ICD-10-CM | POA: Diagnosis not present

## 2014-12-07 DIAGNOSIS — I10 Essential (primary) hypertension: Secondary | ICD-10-CM | POA: Diagnosis not present

## 2014-12-07 DIAGNOSIS — M25861 Other specified joint disorders, right knee: Secondary | ICD-10-CM | POA: Diagnosis not present

## 2014-12-07 DIAGNOSIS — E785 Hyperlipidemia, unspecified: Secondary | ICD-10-CM | POA: Diagnosis not present

## 2014-12-07 DIAGNOSIS — Z96652 Presence of left artificial knee joint: Secondary | ICD-10-CM | POA: Diagnosis not present

## 2014-12-14 DIAGNOSIS — H02811 Retained foreign body in right upper eyelid: Secondary | ICD-10-CM | POA: Diagnosis not present

## 2014-12-21 DIAGNOSIS — M1990 Unspecified osteoarthritis, unspecified site: Secondary | ICD-10-CM | POA: Diagnosis not present

## 2014-12-21 DIAGNOSIS — M25861 Other specified joint disorders, right knee: Secondary | ICD-10-CM | POA: Diagnosis not present

## 2014-12-21 DIAGNOSIS — I10 Essential (primary) hypertension: Secondary | ICD-10-CM | POA: Diagnosis not present

## 2014-12-21 DIAGNOSIS — Z79899 Other long term (current) drug therapy: Secondary | ICD-10-CM | POA: Diagnosis not present

## 2014-12-21 DIAGNOSIS — E785 Hyperlipidemia, unspecified: Secondary | ICD-10-CM | POA: Diagnosis not present

## 2014-12-21 DIAGNOSIS — Z96651 Presence of right artificial knee joint: Secondary | ICD-10-CM | POA: Diagnosis not present

## 2014-12-21 DIAGNOSIS — E119 Type 2 diabetes mellitus without complications: Secondary | ICD-10-CM | POA: Diagnosis not present

## 2015-01-03 ENCOUNTER — Other Ambulatory Visit: Payer: Self-pay | Admitting: Endocrinology

## 2015-01-03 ENCOUNTER — Telehealth: Payer: Self-pay | Admitting: Endocrinology

## 2015-01-03 MED ORDER — TEMAZEPAM 30 MG PO CAPS: 30.0000 mg | ORAL_CAPSULE | Freq: Every evening | ORAL | Status: DC | PRN

## 2015-01-03 NOTE — Telephone Encounter (Addendum)
Rx faced to pharmacy.

## 2015-01-03 NOTE — Telephone Encounter (Signed)
Patient states that his pharmacy has been faxing over medication requests  Please advise   Rx: Temazepam   Thank you

## 2015-01-03 NOTE — Telephone Encounter (Signed)
See below and please advise if ok to Refill Temazepam.  Thanks!

## 2015-01-03 NOTE — Telephone Encounter (Signed)
i printed 

## 2015-03-30 DIAGNOSIS — L820 Inflamed seborrheic keratosis: Secondary | ICD-10-CM | POA: Diagnosis not present

## 2015-03-30 DIAGNOSIS — D225 Melanocytic nevi of trunk: Secondary | ICD-10-CM | POA: Diagnosis not present

## 2015-03-30 DIAGNOSIS — L570 Actinic keratosis: Secondary | ICD-10-CM | POA: Diagnosis not present

## 2015-03-30 DIAGNOSIS — L814 Other melanin hyperpigmentation: Secondary | ICD-10-CM | POA: Diagnosis not present

## 2015-03-30 DIAGNOSIS — L821 Other seborrheic keratosis: Secondary | ICD-10-CM | POA: Diagnosis not present

## 2015-03-30 DIAGNOSIS — D1801 Hemangioma of skin and subcutaneous tissue: Secondary | ICD-10-CM | POA: Diagnosis not present

## 2015-04-03 ENCOUNTER — Other Ambulatory Visit: Payer: Self-pay | Admitting: Endocrinology

## 2015-04-04 NOTE — Telephone Encounter (Signed)
Please advise if we can sent rx. Patient has not been seen since May of 2015.  Thanks!

## 2015-04-08 ENCOUNTER — Other Ambulatory Visit: Payer: Self-pay | Admitting: Endocrinology

## 2015-05-10 ENCOUNTER — Other Ambulatory Visit: Payer: Self-pay | Admitting: Endocrinology

## 2015-05-10 NOTE — Telephone Encounter (Signed)
Please advise if ok to refill. Last office visit was 04/19/2014. Thanks!

## 2015-05-30 ENCOUNTER — Other Ambulatory Visit: Payer: Self-pay

## 2015-06-19 ENCOUNTER — Other Ambulatory Visit: Payer: Self-pay | Admitting: Endocrinology

## 2015-06-20 NOTE — Telephone Encounter (Signed)
Please advise if ok to refill Rx. Last office visit was 04/19/14. Thanks!

## 2015-06-29 ENCOUNTER — Other Ambulatory Visit: Payer: Self-pay | Admitting: Endocrinology

## 2015-07-28 ENCOUNTER — Ambulatory Visit (INDEPENDENT_AMBULATORY_CARE_PROVIDER_SITE_OTHER): Payer: Medicare Other | Admitting: Endocrinology

## 2015-07-28 ENCOUNTER — Encounter: Payer: Self-pay | Admitting: Endocrinology

## 2015-07-28 ENCOUNTER — Other Ambulatory Visit (INDEPENDENT_AMBULATORY_CARE_PROVIDER_SITE_OTHER): Payer: Medicare Other

## 2015-07-28 VITALS — BP 132/68 | HR 61 | Temp 98.2°F | Ht 67.0 in | Wt 179.0 lb

## 2015-07-28 DIAGNOSIS — Z0000 Encounter for general adult medical examination without abnormal findings: Secondary | ICD-10-CM

## 2015-07-28 DIAGNOSIS — E119 Type 2 diabetes mellitus without complications: Secondary | ICD-10-CM

## 2015-07-28 DIAGNOSIS — Z125 Encounter for screening for malignant neoplasm of prostate: Secondary | ICD-10-CM

## 2015-07-28 DIAGNOSIS — I10 Essential (primary) hypertension: Secondary | ICD-10-CM

## 2015-07-28 LAB — HEPATIC FUNCTION PANEL
ALBUMIN: 4 g/dL (ref 3.5–5.2)
ALK PHOS: 41 U/L (ref 39–117)
ALT: 34 U/L (ref 0–53)
AST: 29 U/L (ref 0–37)
BILIRUBIN DIRECT: 0.1 mg/dL (ref 0.0–0.3)
Total Bilirubin: 0.6 mg/dL (ref 0.2–1.2)
Total Protein: 6.6 g/dL (ref 6.0–8.3)

## 2015-07-28 LAB — CBC WITH DIFFERENTIAL/PLATELET
BASOS PCT: 0.3 % (ref 0.0–3.0)
Basophils Absolute: 0 10*3/uL (ref 0.0–0.1)
EOS PCT: 3.8 % (ref 0.0–5.0)
Eosinophils Absolute: 0.3 10*3/uL (ref 0.0–0.7)
HCT: 42.8 % (ref 39.0–52.0)
Hemoglobin: 14.4 g/dL (ref 13.0–17.0)
LYMPHS ABS: 2.2 10*3/uL (ref 0.7–4.0)
Lymphocytes Relative: 33 % (ref 12.0–46.0)
MCHC: 33.5 g/dL (ref 30.0–36.0)
MCV: 90.3 fl (ref 78.0–100.0)
MONO ABS: 0.5 10*3/uL (ref 0.1–1.0)
MONOS PCT: 7.7 % (ref 3.0–12.0)
NEUTROS ABS: 3.8 10*3/uL (ref 1.4–7.7)
NEUTROS PCT: 55.2 % (ref 43.0–77.0)
PLATELETS: 204 10*3/uL (ref 150.0–400.0)
RBC: 4.74 Mil/uL (ref 4.22–5.81)
RDW: 14 % (ref 11.5–15.5)
WBC: 6.8 10*3/uL (ref 4.0–10.5)

## 2015-07-28 LAB — LIPID PANEL
Cholesterol: 118 mg/dL (ref 0–200)
HDL: 36.8 mg/dL — AB (ref >39.00)
LDL CALC: 44 mg/dL (ref 0–99)
NONHDL: 81.38
Total CHOL/HDL Ratio: 3
Triglycerides: 186 mg/dL — ABNORMAL HIGH (ref 0.0–149.0)
VLDL: 37.2 mg/dL (ref 0.0–40.0)

## 2015-07-28 LAB — PSA, MEDICARE: PSA: 0.64 ng/mL (ref 0.10–4.00)

## 2015-07-28 LAB — URINALYSIS, ROUTINE W REFLEX MICROSCOPIC
Bilirubin Urine: NEGATIVE
HGB URINE DIPSTICK: NEGATIVE
Ketones, ur: NEGATIVE
Leukocytes, UA: NEGATIVE
NITRITE: NEGATIVE
PH: 7 (ref 5.0–8.0)
SPECIFIC GRAVITY, URINE: 1.01 (ref 1.000–1.030)
TOTAL PROTEIN, URINE-UPE24: NEGATIVE
URINE GLUCOSE: 250 — AB
Urobilinogen, UA: 0.2 (ref 0.0–1.0)

## 2015-07-28 LAB — MICROALBUMIN / CREATININE URINE RATIO
Creatinine,U: 50.3 mg/dL
Microalb Creat Ratio: 1.4 mg/g (ref 0.0–30.0)

## 2015-07-28 LAB — BASIC METABOLIC PANEL
BUN: 21 mg/dL (ref 6–23)
CALCIUM: 9.3 mg/dL (ref 8.4–10.5)
CO2: 28 meq/L (ref 19–32)
Chloride: 106 mEq/L (ref 96–112)
Creatinine, Ser: 1.18 mg/dL (ref 0.40–1.50)
GFR: 64.15 mL/min (ref >60.00)
GLUCOSE: 168 mg/dL — AB (ref 70–99)
Potassium: 4.4 mEq/L (ref 3.5–5.1)
SODIUM: 139 meq/L (ref 135–145)

## 2015-07-28 LAB — TSH: TSH: 1.19 u[IU]/mL (ref 0.35–4.50)

## 2015-07-28 LAB — POCT GLYCOSYLATED HEMOGLOBIN (HGB A1C): HEMOGLOBIN A1C: 6.7

## 2015-07-28 LAB — HEMOGLOBIN A1C: Hgb A1c MFr Bld: 7 % — ABNORMAL HIGH (ref 4.6–6.5)

## 2015-07-28 NOTE — Patient Instructions (Addendum)
good diet and exercise significantly improve the control of your diabetes.  please let me know if you wish to be referred to a dietician.  high blood sugar is very risky to your health.  you should see an eye doctor and dentist every year.  It is very important to get all recommended vaccinations.  please consider these measures for your health:  minimize alcohol.  do not use tobacco products.  have a colonoscopy at least every 10 years from age 74.  keep firearms safely stored.  always use seat belts.  have working smoke alarms in your home.  see an eye doctor and dentist regularly.  never drive under the influence of alcohol or drugs (including prescription drugs).  those with fair skin should take precautions against the sun. it is critically important to prevent falling down (keep floor areas well-lit, dry, and free of loose objects.  If you have a cane, walker, or wheelchair, you should use it, even for short trips around the house.  Also, try not to rush).  Please return in 1 year.

## 2015-07-28 NOTE — Progress Notes (Signed)
Subjective:    Patient ID: Derek [name], male    DOB: 1941/06/23, 74 y.o.   MRN: [id number]  HPI The state of at least three ongoing medical problems is addressed today, with interval history of each noted here: Dyslipidemia: he denies chest pain.   HTN: he has not required medication: he denies sob. DM: he denies weight change. Past Medical History  Diagnosis Date  . DIABETES MELLITUS, TYPE II 10/22/2007  . COLONIC POLYPS, HX OF 07/30/2007  . SKIN RASH 05/06/2009  . INSOMNIA 11/10/2008  . Pain in Soft Tissues of Limb 10/24/2007  . OSTEOARTHRITIS 07/30/2007  . 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 ESOPHAGEAL STRICTURE 03/22/2009  . Essential hypertension, benign 01/26/2008  . HYPERCHOLESTEROLEMIA 10/24/2007  . History of kidney stones   . GERD (gastroesophageal reflux disease)     Past Surgical History  Procedure Laterality Date  . Replacement total knee  2007    RT  . Lower arterial  07/30/2006  . Cataract extraction, bilateral  2002    with implants  . Colonoscopy    . Tonsillectomy and adenoidectomy    . Upper gastrointestinal endoscopy      Social History   Social History  . Marital Status: Widowed    Spouse Name: N/A  . Number of Children: N/A  . Years of Education: N/A   Occupational History  . Not on file.   Social History Main Topics  . Smoking status: Never Smoker   . Smokeless tobacco: Never Used     Comment: Diet and Activity - Good  . Alcohol Use: No     Comment: Rare  . Drug Use: No  . Sexual Activity: Not on file   Other Topics Concern  . Not on file   Social History Narrative    Current Outpatient Prescriptions on File Prior to Visit  Medication Sig Dispense Refill  . aspirin 81 MG tablet Take 81 mg by mouth daily.    . CRESTOR 40 MG tablet TAKE ONE TABLET BY MOUTH ONE TIME DAILY 90 tablet 2  . ibuprofen (ADVIL,MOTRIN) 200 MG tablet Take 200 mg by mouth every 6 (six) hours as needed.    . metFORMIN (GLUCOPHAGE-XR) 500 MG 24 hr  tablet TAKE TWO TABLETS BY MOUTH DAILY WITH BREAKFAST. *LAST REFILL* APPOINTMENT NEEDS TO BE SCHEDULED* (Patient taking differently: TAKE TWO TABLETS BY MOUTH DAILY WITH BREAKFAST.) 60 tablet 0  . temazepam (RESTORIL) 30 MG capsule TAKE 1 CAPSULE BY MOUTH ONCE A DAY --AT BEDTIME AS NEEDED 30 capsule 0   No current facility-administered medications on file prior to visit.    No Known Allergies  Family History  Problem Relation Age of Onset  . Cancer Father     Bone, per pt  . Prostate cancer Father   . Heart disease Mother     BP 132/68 mmHg  Pulse 61  Temp(Src) 98.2 F (36.8 C) (Oral)  Ht 5\' 7"  (1.702 m)  Wt 179 lb (81.194 kg)  BMI 28.03 kg/m2  SpO2 95%  Review of Systems Denies edema and decreased urinary stream.      Objective:   Physical Exam VITAL SIGNS:  See vs page GENERAL: no distress NECK: There is no palpable thyroid enlargement.  No thyroid nodule is palpable.  No palpable lymphadenopathy at the anterior neck. LUNGS:  Clear to auscultation HEART:  Regular rate and rhythm without murmurs noted. Normal S1,S2.   Pulses: dorsalis pedis intact bilat.   MSK: no deformity of the feet  CV: trace bilat leg edema.   Skin:  no ulcer on the feet.  normal color and temp on the feet. Neuro: sensation is intact to touch on the feet    ecg is declined  Lab Results  Component Value Date   WBC 6.8 07/28/2015   HGB 14.4 07/28/2015   HCT 42.8 07/28/2015   PLT 204.0 07/28/2015   GLUCOSE 168* 07/28/2015   CHOL 118 07/28/2015   TRIG 186.0* 07/28/2015   HDL 36.80* 07/28/2015   LDLDIRECT 65.9 10/19/2013   LDLCALC 44 07/28/2015   ALT 34 07/28/2015   AST 29 07/28/2015   NA 139 07/28/2015   K 4.4 07/28/2015   CL 106 07/28/2015   CREATININE 1.18 07/28/2015   BUN 21 07/28/2015   CO2 28 07/28/2015   TSH 1.19 07/28/2015   PSA 0.64 07/28/2015   HGBA1C 7.0* 07/28/2015   MICROALBUR <0.7 07/28/2015      Assessment & Plan:  DM: slightly worse.  i advised pt to add  metformin, or add another medication Dyslipidemia: well-controlled.  Same crestor. HTN: no medication needed now.  We'll follow.     Subjective:   Patient here for Medicare annual wellness visit and management of other chronic and acute problems.  Risk factors: advanced age    15 of Physicians Providing Medical Care to Patient:  See "snapshot"  Activities of Daily Living: In your present state of health, do you have any difficulty performing the following activities?:  Preparing food and eating?: No  Bathing yourself: No  Getting dressed: No  Using the toilet:No  Moving around from place to place: No  In the past year have you fallen or had a near fall?: No    Home Safety: Has smoke detector and wears seat belts. No firearms. No excess sun exposure.  Diet and Exercise  Current exercise habits: pt says good Dietary issues discussed: pt reports a healthy diet   Depression Screen  Q1: Over the past two weeks, have you felt down, depressed or hopeless?no  Q2: Over the past two weeks, have you felt little interest or pleasure in doing things? no   The following portions of the patient's history were reviewed and updated as appropriate: allergies, current medications, past family history, past medical history, past social history, past surgical history and problem list.   Review of Systems  Denies hearing loss, and visual loss Objective:   Vision:  Will see opthalmologist soon--has appt Hearing: grossly normal Body mass index:  See vs page Msk: pt easily and quickly performs "get-up-and-go" from a sitting position.  Cognitive Impairment Assessment: cognition, memory and judgment appear normal.  remembers 3/3 at 5 minutes.  excellent recall.  can easily read and write a sentence.  alert and oriented x 3.   Assessment:   Medicare wellness utd on preventive parameters    Plan:   During the course of the visit the patient was educated and counseled about appropriate screening  and preventive services including:        Fall prevention    Diabetes screening  Nutrition counseling   Vaccines / LABS Zostavax / Pneumococcal Vaccine  today  PSA  Patient Instructions (the written plan) was given to the patient.

## 2015-07-28 NOTE — Progress Notes (Signed)
we discussed code status.  pt requests full code, but would not want to be started or maintained on artificial life-support measures if there was not a reasonable chance of recovery 

## 2015-07-29 ENCOUNTER — Other Ambulatory Visit: Payer: Self-pay | Admitting: Endocrinology

## 2015-08-02 ENCOUNTER — Other Ambulatory Visit: Payer: Self-pay | Admitting: Endocrinology

## 2015-08-10 ENCOUNTER — Telehealth: Payer: Self-pay | Admitting: Endocrinology

## 2015-08-10 NOTE — Telephone Encounter (Signed)
Most commonly used is "[place]," but this would depend on your insurance

## 2015-08-10 NOTE — Telephone Encounter (Signed)
Please call pt about if he needs to increase his metformen call pt [phone]

## 2015-08-10 NOTE — Telephone Encounter (Signed)
I contacted the pt. He wanted to know what his metformin would be increased to and what the alternative medication would be. Pt wants to know this information before making his decision.

## 2015-08-11 ENCOUNTER — Telehealth: Payer: Self-pay

## 2015-08-11 MED ORDER — SITAGLIPTIN PHOSPHATE 100 MG PO TABS: 100.0000 mg | ORAL_TABLET | Freq: Every day | ORAL | Status: DC

## 2015-08-11 NOTE — Telephone Encounter (Signed)
Pt advised rx has been sent.

## 2015-08-11 NOTE — Telephone Encounter (Signed)
Ok, i have sent a prescription to your pharmacy  

## 2015-08-11 NOTE — Telephone Encounter (Signed)
Error

## 2015-08-11 NOTE — Telephone Encounter (Signed)
I contacted the patient. He states he would like to try Januvia.

## 2015-08-11 NOTE — Addendum Note (Signed)
Addended by: Renato Shin on: 08/11/2015 02:14 PM   Modules accepted: Orders

## 2015-08-13 ENCOUNTER — Other Ambulatory Visit: Payer: Self-pay | Admitting: Endocrinology

## 2015-08-15 ENCOUNTER — Telehealth: Payer: Self-pay | Admitting: Endocrinology

## 2015-08-15 NOTE — Telephone Encounter (Signed)
I contacted the patient. The patient stated the pharmacy never received the temazepam rx from 8/29. Rx has be re-sent.

## 2015-08-15 NOTE — Telephone Encounter (Signed)
Please call pt back about scripts, did not elaborate with me

## 2015-08-16 ENCOUNTER — Other Ambulatory Visit: Payer: Self-pay | Admitting: Endocrinology

## 2015-08-16 MED ORDER — ROSUVASTATIN CALCIUM 40 MG PO TABS: 40.0000 mg | ORAL_TABLET | Freq: Every day | ORAL | Status: DC

## 2015-08-16 MED ORDER — METFORMIN HCL ER 500 MG PO TB24: ORAL_TABLET | ORAL | Status: DC

## 2015-08-26 ENCOUNTER — Telehealth: Payer: Self-pay | Admitting: Endocrinology

## 2015-08-26 NOTE — Telephone Encounter (Signed)
Left pt a Vm to figure out medication name

## 2015-08-26 NOTE — Telephone Encounter (Signed)
Yes, please continue [place]

## 2015-08-26 NOTE — Telephone Encounter (Signed)
Pt is aware that he is to continue [place] medication

## 2015-08-26 NOTE — Telephone Encounter (Signed)
Patient stated that new prescription (he don't know the name of it) do he continue taking it with the metformin, please advise

## 2015-09-21 ENCOUNTER — Telehealth: Payer: Self-pay | Admitting: Endocrinology

## 2015-09-21 NOTE — Telephone Encounter (Signed)
Pt requesting restoril please to be sent to [facility]

## 2015-09-22 NOTE — Telephone Encounter (Signed)
Please see below.

## 2015-09-23 MED ORDER — TEMAZEPAM 30 MG PO CAPS: 30.0000 mg | ORAL_CAPSULE | Freq: Every evening | ORAL | Status: DC | PRN

## 2015-09-23 NOTE — Telephone Encounter (Signed)
i printed 

## 2015-09-23 NOTE — Telephone Encounter (Signed)
Took to front

## 2015-09-28 ENCOUNTER — Other Ambulatory Visit: Payer: Self-pay | Admitting: Dermatology

## 2015-09-28 DIAGNOSIS — C44222 Squamous cell carcinoma of skin of right ear and external auricular canal: Secondary | ICD-10-CM | POA: Diagnosis not present

## 2015-09-28 DIAGNOSIS — L570 Actinic keratosis: Secondary | ICD-10-CM | POA: Diagnosis not present

## 2015-09-28 DIAGNOSIS — D0421 Carcinoma in situ of skin of right ear and external auricular canal: Secondary | ICD-10-CM | POA: Diagnosis not present

## 2015-10-20 ENCOUNTER — Telehealth: Payer: Self-pay

## 2015-10-20 NOTE — Telephone Encounter (Signed)
Pt is requesting a refill of his temazepam. Last rx was 09/23/2015.

## 2015-10-20 NOTE — Telephone Encounter (Signed)
Pharmacy notified.

## 2015-10-20 NOTE — Telephone Encounter (Signed)
Last month's rx has 3 more refills

## 2015-10-24 ENCOUNTER — Telehealth: Payer: Self-pay | Admitting: Endocrinology

## 2015-10-24 MED ORDER — TEMAZEPAM 30 MG PO CAPS: 30.0000 mg | ORAL_CAPSULE | Freq: Every evening | ORAL | Status: DC | PRN

## 2015-10-24 NOTE — Telephone Encounter (Signed)
Rx sent per pt's request.  

## 2015-10-24 NOTE — Telephone Encounter (Signed)
Patient called stating that he would like a new Rx   Rx: Temazepam (90 day please!)  Pharmacy: [facility]   Thank you

## 2015-10-25 NOTE — Telephone Encounter (Signed)
Pt's preferred [facility] pharmacy fax number is busy. Rx was verbally called in on 10/25/2015.

## 2015-11-17 DIAGNOSIS — L821 Other seborrheic keratosis: Secondary | ICD-10-CM | POA: Diagnosis not present

## 2015-11-17 DIAGNOSIS — L570 Actinic keratosis: Secondary | ICD-10-CM | POA: Diagnosis not present

## 2015-11-17 DIAGNOSIS — Z85828 Personal history of other malignant neoplasm of skin: Secondary | ICD-10-CM | POA: Diagnosis not present

## 2015-11-30 ENCOUNTER — Encounter: Payer: Self-pay | Admitting: *Deleted

## 2015-12-13 DIAGNOSIS — E113393 Type 2 diabetes mellitus with moderate nonproliferative diabetic retinopathy without macular edema, bilateral: Secondary | ICD-10-CM | POA: Diagnosis not present

## 2015-12-13 LAB — HM DIABETES EYE EXAM

## 2015-12-20 ENCOUNTER — Encounter: Payer: Self-pay | Admitting: Endocrinology

## 2016-01-16 ENCOUNTER — Other Ambulatory Visit: Payer: Self-pay | Admitting: Endocrinology

## 2016-02-15 DIAGNOSIS — L821 Other seborrheic keratosis: Secondary | ICD-10-CM | POA: Diagnosis not present

## 2016-02-15 DIAGNOSIS — Z85828 Personal history of other malignant neoplasm of skin: Secondary | ICD-10-CM | POA: Diagnosis not present

## 2016-02-28 ENCOUNTER — Telehealth: Payer: Self-pay

## 2016-02-29 NOTE — Telephone Encounter (Signed)
Called patient and left message in regards to flu shot ? Has he had it ?

## 2016-04-24 ENCOUNTER — Other Ambulatory Visit: Payer: Self-pay | Admitting: Endocrinology

## 2016-05-18 ENCOUNTER — Other Ambulatory Visit: Payer: Self-pay | Admitting: Endocrinology

## 2016-07-13 DIAGNOSIS — S93602A Unspecified sprain of left foot, initial encounter: Secondary | ICD-10-CM | POA: Diagnosis not present

## 2016-07-27 ENCOUNTER — Encounter: Payer: Medicare Other | Admitting: Endocrinology

## 2016-07-30 ENCOUNTER — Ambulatory Visit (INDEPENDENT_AMBULATORY_CARE_PROVIDER_SITE_OTHER): Payer: Medicare Other | Admitting: Endocrinology

## 2016-07-30 ENCOUNTER — Ambulatory Visit
Admission: RE | Admit: 2016-07-30 | Discharge: 2016-07-30 | Disposition: A | Discharge status: Home / Self Care | Payer: Medicare Other | Source: Ambulatory Visit | Attending: Endocrinology | Admitting: Endocrinology

## 2016-07-30 ENCOUNTER — Other Ambulatory Visit (INDEPENDENT_AMBULATORY_CARE_PROVIDER_SITE_OTHER): Payer: Medicare Other

## 2016-07-30 ENCOUNTER — Encounter: Payer: Self-pay | Admitting: Endocrinology

## 2016-07-30 VITALS — BP 132/86 | HR 59 | Ht 67.0 in | Wt 175.0 lb

## 2016-07-30 DIAGNOSIS — Z0000 Encounter for general adult medical examination without abnormal findings: Secondary | ICD-10-CM | POA: Diagnosis not present

## 2016-07-30 DIAGNOSIS — R05 Cough: Secondary | ICD-10-CM | POA: Diagnosis not present

## 2016-07-30 DIAGNOSIS — Z125 Encounter for screening for malignant neoplasm of prostate: Secondary | ICD-10-CM

## 2016-07-30 DIAGNOSIS — M25572 Pain in left ankle and joints of left foot: Secondary | ICD-10-CM | POA: Diagnosis not present

## 2016-07-30 DIAGNOSIS — M25579 Pain in unspecified ankle and joints of unspecified foot: Secondary | ICD-10-CM | POA: Insufficient documentation

## 2016-07-30 DIAGNOSIS — E7800 Pure hypercholesterolemia, unspecified: Secondary | ICD-10-CM

## 2016-07-30 DIAGNOSIS — R059 Cough, unspecified: Secondary | ICD-10-CM

## 2016-07-30 DIAGNOSIS — L089 Local infection of the skin and subcutaneous tissue, unspecified: Secondary | ICD-10-CM

## 2016-07-30 DIAGNOSIS — N200 Calculus of kidney: Secondary | ICD-10-CM | POA: Diagnosis not present

## 2016-07-30 DIAGNOSIS — E119 Type 2 diabetes mellitus without complications: Secondary | ICD-10-CM

## 2016-07-30 LAB — BASIC METABOLIC PANEL
BUN: 17 mg/dL (ref 6–23)
CHLORIDE: 104 meq/L (ref 96–112)
CO2: 29 meq/L (ref 19–32)
Calcium: 9.6 mg/dL (ref 8.4–10.5)
Creatinine, Ser: 1.18 mg/dL (ref 0.40–1.50)
GFR: 63.97 mL/min (ref >60.00)
GLUCOSE: 90 mg/dL (ref 70–99)
POTASSIUM: 4.4 meq/L (ref 3.5–5.1)
SODIUM: 139 meq/L (ref 135–145)

## 2016-07-30 LAB — CBC WITH DIFFERENTIAL/PLATELET
BASOS ABS: 0 10*3/uL (ref 0.0–0.1)
BASOS PCT: 0.4 % (ref 0.0–3.0)
Eosinophils Absolute: 0.3 10*3/uL (ref 0.0–0.7)
Eosinophils Relative: 3 % (ref 0.0–5.0)
HEMATOCRIT: 45.1 % (ref 39.0–52.0)
Hemoglobin: 15.1 g/dL (ref 13.0–17.0)
LYMPHS PCT: 27.2 % (ref 12.0–46.0)
Lymphs Abs: 2.4 10*3/uL (ref 0.7–4.0)
MCHC: 33.6 g/dL (ref 30.0–36.0)
MCV: 89.4 fl (ref 78.0–100.0)
MONOS PCT: 9.2 % (ref 3.0–12.0)
Monocytes Absolute: 0.8 10*3/uL (ref 0.1–1.0)
NEUTROS ABS: 5.2 10*3/uL (ref 1.4–7.7)
Neutrophils Relative %: 60.2 % (ref 43.0–77.0)
PLATELETS: 218 10*3/uL (ref 150.0–400.0)
RBC: 5.05 Mil/uL (ref 4.22–5.81)
RDW: 14.3 % (ref 11.5–15.5)
WBC: 8.7 10*3/uL (ref 4.0–10.5)

## 2016-07-30 LAB — LIPID PANEL
CHOLESTEROL: 141 mg/dL (ref 0–200)
HDL: 40.8 mg/dL (ref >39.00)
LDL Cholesterol: 72 mg/dL (ref 0–99)
NonHDL: 99.91
TRIGLYCERIDES: 140 mg/dL (ref 0.0–149.0)
Total CHOL/HDL Ratio: 3
VLDL: 28 mg/dL (ref 0.0–40.0)

## 2016-07-30 LAB — HEPATIC FUNCTION PANEL
ALT: 22 U/L (ref 0–53)
AST: 19 U/L (ref 0–37)
Albumin: 4.4 g/dL (ref 3.5–5.2)
Alkaline Phosphatase: 47 U/L (ref 39–117)
BILIRUBIN DIRECT: 0.2 mg/dL (ref 0.0–0.3)
BILIRUBIN TOTAL: 0.8 mg/dL (ref 0.2–1.2)
Total Protein: 7.2 g/dL (ref 6.0–8.3)

## 2016-07-30 LAB — URINALYSIS, ROUTINE W REFLEX MICROSCOPIC
Bilirubin Urine: NEGATIVE
Hgb urine dipstick: NEGATIVE
Ketones, ur: NEGATIVE
Leukocytes, UA: NEGATIVE
Nitrite: NEGATIVE
RBC / HPF: NONE SEEN (ref 0–?)
Specific Gravity, Urine: 1.01 (ref 1.000–1.030)
Total Protein, Urine: NEGATIVE
Urine Glucose: NEGATIVE
Urobilinogen, UA: 0.2 (ref 0.0–1.0)
WBC, UA: NONE SEEN (ref 0–?)
pH: 6.5 (ref 5.0–8.0)

## 2016-07-30 LAB — HEMOGLOBIN A1C: Hgb A1c MFr Bld: 7.3 % — ABNORMAL HIGH (ref 4.6–6.5)

## 2016-07-30 LAB — MICROALBUMIN / CREATININE URINE RATIO
Creatinine,U: 48.6 mg/dL
Microalb Creat Ratio: 1.6 mg/g (ref 0.0–30.0)
Microalb, Ur: 0.8 mg/dL (ref 0.0–1.9)

## 2016-07-30 LAB — TSH: TSH: 1.77 u[IU]/mL (ref 0.35–4.50)

## 2016-07-30 LAB — POCT GLYCOSYLATED HEMOGLOBIN (HGB A1C): HEMOGLOBIN A1C: 7

## 2016-07-30 LAB — PSA, MEDICARE: PSA: 1.35 ng/ml (ref 0.10–4.00)

## 2016-07-30 MED ORDER — DOXYCYCLINE HYCLATE 100 MG PO TABS: 100.0000 mg | ORAL_TABLET | Freq: Two times a day (BID) | ORAL | 0 refills | Status: DC

## 2016-07-30 NOTE — Progress Notes (Signed)
we discussed code status.  pt requests full code, but would not want to be started or maintained on artificial life-support measures if there was not a reasonable chance of recovery 

## 2016-07-30 NOTE — Progress Notes (Signed)
Subjective:    Patient ID: Derek [name], male    DOB: 30-Oct-1941, 75 y.o.   MRN: [id number]  HPI Pt reports few months of excessive mucous production from the chest, but no assoc wheezing.    He has nodule on the left scrotum since bitten by a tic a few weeks ago.   Past Medical History:  Diagnosis Date  . COLONIC POLYPS, HX OF 07/30/2007  . DIABETES MELLITUS, TYPE II 10/22/2007  . 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 ESOPHAGEAL STRICTURE 03/22/2009  . Essential hypertension, benign 01/26/2008  . GERD (gastroesophageal reflux disease)   . History of kidney stones   . HYPERCHOLESTEROLEMIA 10/24/2007  . INSOMNIA 11/10/2008  . OSTEOARTHRITIS 07/30/2007  . Pain in Soft Tissues of Limb 10/24/2007  . SKIN RASH 05/06/2009    Past Surgical History:  Procedure Laterality Date  . CATARACT EXTRACTION, BILATERAL  2002   with implants  . COLONOSCOPY    . lower arterial  07/30/2006  . REPLACEMENT TOTAL KNEE  2007   RT  . TONSILLECTOMY AND ADENOIDECTOMY    . UPPER GASTROINTESTINAL ENDOSCOPY      Social History   Social History  . Marital status: Widowed    Spouse name: N/A  . Number of children: N/A  . Years of education: N/A   Occupational History  . Not on file.   Social History Main Topics  . Smoking status: Never Smoker  . Smokeless tobacco: Never Used     Comment: Diet and Activity - Good  . Alcohol use No     Comment: Rare  . Drug use: No  . Sexual activity: Not on file   Other Topics Concern  . Not on file   Social History Narrative  . No narrative on file    Current Outpatient Prescriptions on File Prior to Visit  Medication Sig Dispense Refill  . aspirin 81 MG tablet Take 81 mg by mouth daily.    Marland Kitchen ibuprofen (ADVIL,MOTRIN) 200 MG tablet Take 200 mg by mouth every 6 (six) hours as needed.    . metFORMIN (GLUCOPHAGE-XR) 500 MG 24 hr tablet take 2 tablets by mouth once daily WITH BREAKFAST 180 tablet 0  . rosuvastatin (CRESTOR) 40 MG tablet Take 1 tablet  (40 mg total) by mouth daily. 90 tablet 2  . sitaGLIPtin (JANUVIA) 100 MG tablet Take 1 tablet (100 mg total) by mouth daily. 30 tablet 11  . temazepam (RESTORIL) 30 MG capsule take 1 capsule by mouth at bedtime if needed 90 capsule 1   No current facility-administered medications on file prior to visit.     No Known Allergies  Family History  Problem Relation Age of Onset  . Cancer Father     Bone, per pt  . Prostate cancer Father   . Heart disease Mother     BP 132/86   Pulse (!) 59   Ht 5\' 7"  (1.702 m)   Wt 175 lb (79.4 kg)   BMI 27.41 kg/m    Review of Systems Denies wheezing and sob.  Left foot pain persists after sprain 3 weeks ago (X-rays were neg at [facility] in [place] then).      Objective:   Physical Exam VITAL SIGNS:  See vs page.   GENERAL: no distress.  LUNGS:  Clear to auscultation.  HEART:  Regular rate and rhythm without murmurs noted. Normal S1,S2.   Pulses: dorsalis pedis intact bilat.   MSK: no deformity of the feet.  CV: no leg  edema.  Skin:  no ulcer on the feet.  normal color and temp on the feet.   Neuro: sensation is intact to touch on the feet.   Scrotum: 1 cm tender nodule in the skin.     i personally reviewed electrocardiogram tracing (today): Indication: DM Impression: SB  Lab Results  Component Value Date   HGBA1C 7.0 07/30/2016      Assessment & Plan:  Pustule, new.  I rx'ed antibiotic.   Cough, new, uncertain etiology.  prob allergic.  check CXR. Type 2 DM: well-controlled.  Please continue the same medication.     Subjective:   Patient here for Medicare annual wellness visit and management of other chronic and acute problems.     Risk factors: advanced age    32 of Physicians Providing Medical Care to Patient:  See "snapshot"   Activities of Daily Living: In your present state of health, do you have any difficulty performing the following activities?:  Preparing food and eating?: No  Bathing yourself:  No  Getting  dressed:  No  Using the toilet:  No  Moving around from place to place:  No  In the past year have you fallen or had a near fall?: No    Home Safety: Has smoke detector and wears seat belts. No firearms. No excess sun exposure.    Diet and Exercise  Current exercise habits: pt says good Dietary issues discussed: pt reports a healthy diet   Depression Screen  Q1: Over the past two weeks, have you felt down, depressed or hopeless? no  Q2: Over the past two weeks, have you felt little interest or pleasure in doing things? no   The following portions of the patient's history were reviewed and updated as appropriate: allergies, current medications, past family history, past medical history, past social history, past surgical history and problem list.   Review of Systems  Denies hearing loss, and visual loss Objective:   Vision:  Advertising account executive, so he declines VA today.   Hearing: grossly normal Body mass index:  See vs page Msk: pt easily and quickly performs "get-up-and-go" from a sitting position.   Cognitive Impairment Assessment: cognition, memory and judgment appear normal.   remembers 3/3 at 5 minutes.  excellent recall.  can easily read and write a sentence.  alert and oriented x 3.    Assessment:   Medicare wellness utd on preventive parameters    Plan:   During the course of the visit the patient was educated and counseled about appropriate screening and preventive services including:        Fall prevention.     Diabetes screening  Nutrition counseling   LABS are ordered today.    Patient Instructions (the written plan) was given to the patient.

## 2016-07-30 NOTE — Patient Instructions (Addendum)
Let's recheck your chest x-ray.   I have sent a prescription to your pharmacy, for an antibiotic pill.   blood tests are requested for you today.  We'll let you know about the results.   good diet and exercise significantly improve the control of your diabetes.  please let me know if you wish to be referred to a dietician.  high blood sugar is very risky to your health.  you should see an eye doctor and dentist every year.  It is very important to get all recommended vaccinations.  please consider these measures for your health:  minimize alcohol.  do not use tobacco products.  have a colonoscopy at least every 10 years from age 52.  keep firearms safely stored.  always use seat belts.  have working smoke alarms in your home.  see an eye doctor and dentist regularly.  never drive under the influence of alcohol or drugs (including prescription drugs).  those with fair skin should take precautions against the sun. it is critically important to prevent falling down (keep floor areas well-lit, dry, and free of loose objects.  If you have a cane, walker, or wheelchair, you should use it, even for short trips around the house.  Also, try not to rush).   Please return in 1 year.

## 2016-07-31 LAB — PTH, INTACT AND CALCIUM
CALCIUM: 10.2 mg/dL (ref 8.6–10.3)
PTH: 16 pg/mL (ref 14–64)

## 2016-08-15 DIAGNOSIS — L820 Inflamed seborrheic keratosis: Secondary | ICD-10-CM | POA: Diagnosis not present

## 2016-08-15 DIAGNOSIS — L570 Actinic keratosis: Secondary | ICD-10-CM | POA: Diagnosis not present

## 2016-08-15 DIAGNOSIS — L739 Follicular disorder, unspecified: Secondary | ICD-10-CM | POA: Diagnosis not present

## 2016-08-15 DIAGNOSIS — D235 Other benign neoplasm of skin of trunk: Secondary | ICD-10-CM | POA: Diagnosis not present

## 2016-08-15 DIAGNOSIS — C44319 Basal cell carcinoma of skin of other parts of face: Secondary | ICD-10-CM | POA: Diagnosis not present

## 2016-08-15 DIAGNOSIS — L821 Other seborrheic keratosis: Secondary | ICD-10-CM | POA: Diagnosis not present

## 2016-08-15 DIAGNOSIS — D0339 Melanoma in situ of other parts of face: Secondary | ICD-10-CM | POA: Diagnosis not present

## 2016-08-15 DIAGNOSIS — D1801 Hemangioma of skin and subcutaneous tissue: Secondary | ICD-10-CM | POA: Diagnosis not present

## 2016-08-16 ENCOUNTER — Telehealth: Payer: Self-pay

## 2016-08-16 NOTE — Telephone Encounter (Signed)
Patient called about results of his chest X-ray from 07/30/2016.  Hi Mr. Derek [name]:  Looks good. Let me know if you want to see a specialist.   Patient wanted to know what specialist he would see before agreeing to the referral.

## 2016-08-16 NOTE — Telephone Encounter (Signed)
It would be a lung specialist, for the cough.

## 2016-08-16 NOTE — Telephone Encounter (Signed)
Attempted to reach the patient. Patient was unavailable. Will try again at a later time.

## 2016-08-17 ENCOUNTER — Other Ambulatory Visit: Payer: Self-pay | Admitting: Endocrinology

## 2016-08-17 DIAGNOSIS — R059 Cough, unspecified: Secondary | ICD-10-CM

## 2016-08-17 DIAGNOSIS — R05 Cough: Secondary | ICD-10-CM

## 2016-08-17 NOTE — Telephone Encounter (Signed)
Pt is requesting for our lung referral to be printed for him

## 2016-08-17 NOTE — Telephone Encounter (Signed)
See messages regarding referral.

## 2016-08-17 NOTE — Telephone Encounter (Signed)
I contacted the patient and left a voicemail advising Dr. Loanne Drilling would like for him to see a lung specialist for the cough and drainage. Pateint advised to call back and advise on how to proceed.

## 2016-08-17 NOTE — Telephone Encounter (Signed)
Referral order mailed to the patient.

## 2016-08-17 NOTE — Telephone Encounter (Signed)
done

## 2016-08-17 NOTE — Telephone Encounter (Signed)
Pt would like to see a lung specialist

## 2016-08-17 NOTE — Telephone Encounter (Signed)
Pt wanted to let Dr. Loanne Drilling know that he has had sinus drainage

## 2016-08-24 ENCOUNTER — Telehealth: Payer: Self-pay | Admitting: Endocrinology

## 2016-08-24 NOTE — Telephone Encounter (Signed)
Pt calling for Pulmonary referral info, he did receive the paperwork per Pt request, just needed to know who to call, [facility]BP[facility] info given

## 2016-08-24 NOTE — Telephone Encounter (Signed)
I contacted the patient and advised of referral location and phone number.  [place]: pulmonary 2nd floor  Address: [address], [place], [place] [address]  [phone]

## 2016-09-05 DIAGNOSIS — D0339 Melanoma in situ of other parts of face: Secondary | ICD-10-CM | POA: Diagnosis not present

## 2016-09-10 ENCOUNTER — Encounter: Payer: Self-pay | Admitting: Internal Medicine

## 2016-09-10 ENCOUNTER — Ambulatory Visit (INDEPENDENT_AMBULATORY_CARE_PROVIDER_SITE_OTHER): Payer: Medicare Other | Admitting: Internal Medicine

## 2016-09-10 VITALS — BP 132/74 | HR 60 | Ht 67.0 in | Wt 175.0 lb

## 2016-09-10 DIAGNOSIS — R059 Cough, unspecified: Secondary | ICD-10-CM

## 2016-09-10 DIAGNOSIS — R05 Cough: Secondary | ICD-10-CM | POA: Diagnosis not present

## 2016-09-10 DIAGNOSIS — K222 Esophageal obstruction: Secondary | ICD-10-CM | POA: Diagnosis not present

## 2016-09-10 NOTE — Progress Notes (Signed)
Subjective:    Patient ID: Derek [name], male    DOB: 11-16-1941,    MRN: [id number]  HPI  89 yowm never smoker with minimal seasonal mostly spring rhinitis no eval/ minimal otc's then fall 2016 noted onset of dysphagia/throat and chest congestions completely resolved for up to several months with sticking his finger down his throat referred to pulmonary clinic 09/10/2016 by Dr   Loanne Drilling for eval of cough   09/10/2016 1st [facility] office visit/ [facility]   Chief Complaint  Patient presents with  . Pulmonary Consult    Referred by Dr. Loanne Drilling. Pt c/o cough and "lots of mucus" for the past year.  He also c/o PND that bothers him in the am and in the evenings. He states every few wks he feels the urge to vomit and get rid of "all the mucus"- clear and stringy.   indolent onset dysphagia resolved with vomiting 4 x over the last year and also issues with pills 3-4days / week assoc with urge to clear throat and excess watery pnds  Also solid food dysphagia x months, has trouble with steak " I can't chew it up enough to pass with my dentures so I avoid it"   No obvious other patterns in day to day or daytime variabilty or assoc sob or excess/ purulent sputum or mucus plugs   or cp or chest tightness, subjective wheeze overt sinus or hb symptoms. No unusual exp hx or h/o childhood pna/ asthma or knowledge of premature birth.  Sleeping ok without nocturnal  or early am exacerbation  of respiratory  c/o's or need for noct saba. Also denies any obvious fluctuation of symptoms with weather or environmental changes or other aggravating or alleviating factors except as outlined above   Current Medications, Allergies, Complete Past Medical History, Past Surgical History, Family History, and Social History were reviewed in [employer] record.            Review of Systems  Constitutional: Negative for activity change, appetite change, chills, fever and unexpected weight  change.  HENT: Positive for congestion and postnasal drip. Negative for dental problem, rhinorrhea, sneezing, sore throat, trouble swallowing and voice change.   Eyes: Negative for visual disturbance.  Respiratory: Positive for cough. Negative for choking and shortness of breath.   Cardiovascular: Negative for chest pain and leg swelling.  Gastrointestinal: Negative for abdominal pain, nausea and vomiting.  Genitourinary: Negative for difficulty urinating.  Musculoskeletal: Negative for arthralgias.  Skin: Negative for rash.  Psychiatric/Behavioral: Negative for behavioral problems and confusion.       Objective:   Physical Exam  amb wm   Wt Readings from Last 3 Encounters:  09/10/16 175 lb (79.4 kg)  07/30/16 175 lb (79.4 kg)  07/28/15 179 lb (81.2 kg)    Vital signs reviewed - Note on arrival 02 sats  96% on RA      HEENT: nl dentition, turbinates, and oropharynx. Nl external ear canals without cough reflex   NECK :  without JVD/Nodes/TM/ nl carotid upstrokes bilaterally   LUNGS: no acc muscle use,  Nl contour chest which is clear to A and P bilaterally without cough on insp or exp maneuvers   CV:  RRR  no s3 or murmur or increase in P2, no edema   ABD:  soft and nontender with nl inspiratory excursion in the supine position. No bruits or organomegaly, bowel sounds nl  MS:  Nl gait/ ext warm without deformities, calf tenderness,  cyanosis or clubbing No obvious joint restrictions   SKIN: warm and dry without lesions    NEURO:  alert, approp, nl sensorium with  no motor deficits     I personally reviewed images and agree with radiology impression as follows:  CXR:   07/30/16 No radiographic evidence of acute cardiopulmonary disease.          Assessment & Plan:

## 2016-09-10 NOTE — Patient Instructions (Addendum)
Try prilosec otc 20mg   Take 30-60 min before first meal of the day and Pepcid ac (famotidine) 20 mg one @  bedtime until you see Dr Ardis Hughs      GERD (REFLUX)  is an extremely common cause of respiratory symptoms just like yours , many times with no obvious heartburn at all.    It can be treated with medication, but also with lifestyle changes including elevation of the head of your bed (ideally with 6 inch  bed blocks),  Smoking cessation, avoidance of late meals, excessive alcohol, and avoid fatty foods, chocolate, peppermint, colas, red wine, and acidic juices such as orange juice.  NO MINT OR MENTHOL PRODUCTS SO NO COUGH DROPS   USE SUGARLESS CANDY INSTEAD (Jolley ranchers or Stover's or Life Savers) or even ice chips will also do - the key is to swallow to prevent all throat clearing. NO OIL BASED VITAMINS - use powdered substitutes.  Please see patient coordinator before you leave today  to schedule GI evaluation by Dr Ardis Hughs

## 2016-09-10 NOTE — Assessment & Plan Note (Signed)
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   These are the same pts (now being labeled as having "irritable larynx syndrome" by some cough centers) who not infrequently have a history of having failed to tolerate ace inhibitors,  dry powder inhalers or biphosphonates or report having atypical/extraesophageal reflux symptoms that don't respond to standard doses of PPI  and are easily confused as having aecopd or asthma flares by even experienced allergists/ pulmonologists (myself included).   So the first step is rx for gerd with acid suppression/ diet and refer to GI for dysphagia ? Needs EGD and if not better then return to office for further w/u to included sinus CT, FENO, allergy profile  Total time devoted to counseling  = 35/1m review case with pt/ discussion of options/alternatives/ personally creating written instructions  in presence of pt  then going over those specific  Instructions directly with the pt including how to use all of the meds but in particular covering each new medication in detail and the difference between the maintenance/automatic meds and the prns using an action plan format for the latter.

## 2016-09-10 NOTE — Assessment & Plan Note (Addendum)
Pt says was dilated in the 1990's - Endo 5/20013 Dr Lyla Son:  Pos for HH/ esophagitis with stricture > referred back to GI

## 2016-09-11 ENCOUNTER — Other Ambulatory Visit: Payer: Self-pay | Admitting: Endocrinology

## 2016-09-18 ENCOUNTER — Other Ambulatory Visit: Payer: Self-pay | Admitting: Endocrinology

## 2016-09-25 ENCOUNTER — Ambulatory Visit (INDEPENDENT_AMBULATORY_CARE_PROVIDER_SITE_OTHER): Payer: Medicare Other | Admitting: Physician Assistant

## 2016-09-25 ENCOUNTER — Encounter: Payer: Self-pay | Admitting: Physician Assistant

## 2016-09-25 VITALS — BP 140/80 | HR 76 | Ht 67.0 in | Wt 175.6 lb

## 2016-09-25 DIAGNOSIS — R12 Heartburn: Secondary | ICD-10-CM

## 2016-09-25 DIAGNOSIS — K219 Gastro-esophageal reflux disease without esophagitis: Secondary | ICD-10-CM

## 2016-09-25 DIAGNOSIS — R1310 Dysphagia, unspecified: Secondary | ICD-10-CM

## 2016-09-25 MED ORDER — OMEPRAZOLE 20 MG PO CPDR: DELAYED_RELEASE_CAPSULE | ORAL | 3 refills | Status: DC

## 2016-09-25 NOTE — Progress Notes (Signed)
Subjective:    Patient ID: Derek [name], male    DOB: [birth year], 75 y.o.   MRN: [id number]  HPI Derek [name] is a pleasant 75 year old white male known to Dr. Ardis [name] who is referred today by Dr. Melvyn [name] for evaluation of GERD, cough and possible esophageal stricture. Patient has history of adult-onset diabetes mellitus, hyperlipidemia, hypertension, and previous colon polyps. He underwent colonoscopy in January 2014 per Dr. Ardis [name] for history of adenomatous polyps. He was found to have one small polyp which was removed but not retrieved and multiple left colon diverticuli. He was recommended for 5 year interval follow-up. He last had EGD in 2003 per Dr. Rachelle [name] with finding of a 2 cm hiatal hernia, moderate esophagitis and a distal esophageal stricture which was Derek [name] dilated to 26F Patient was recently seen in the pulmonary clinic by Dr. Shyrl [name] for complaints of cough and thick mucus/plan. He says that he had been having difficulty for several months with a sensation of mucus or phlegm sticking in his throat. He would have difficulty swallowing pills and feel that things would get hung up for prolonged period of time. He has no difficulty with liquids but had been having some difficulty with solid food. He had no complaint of heartburn or indigestion. Appetite had been fine and weight had been stable. Dr. Melvyn [name] felt this may be reflux induced and that he may have an esophageal stricture. He started omeprazole 20 mg by mouth every morning and famotidine 20 mg every evening. Patient has been taking the meds over the past couple of weeks and says that his symptoms are actually much better. He says since being on the acid blockers he is not having any difficulty with dysphagia or mucus buildup. He says he was having times where he would feel as if something was hung up he would regurgitate and only bring up thick, stringy  Mucus. These symptoms have completely resolved.  Review of Systems Pertinent  positive and negative review of systems were noted in the above HPI section.  All other review of systems was otherwise negative.  Outpatient Encounter Prescriptions as of 09/25/2016  Medication Sig  . aspirin 81 MG tablet Take 81 mg by mouth daily.  . famotidine (PEPCID) 20 MG tablet Take 20 mg by mouth at bedtime.  Marland Kitchen ibuprofen (ADVIL,MOTRIN) 200 MG tablet Take 200 mg by mouth every 6 (six) hours as needed.  Marland Kitchen JANUVIA 100 MG tablet take 1 tablet by mouth once daily  . metFORMIN (GLUCOPHAGE-XR) 500 MG 24 hr tablet take 2 tablets by mouth once daily with BREAKFAST  . omeprazole (PRILOSEC) 20 MG capsule Take 1 tab every morning daily .  . rosuvastatin (CRESTOR) 40 MG tablet Take 1 tablet (40 mg total) by mouth daily.  . temazepam (RESTORIL) 30 MG capsule take 1 capsule by mouth at bedtime if needed  . [DISCONTINUED] omeprazole (PRILOSEC) 20 MG capsule Take 20 mg by mouth daily. otc daily   No facility-administered encounter medications on file as of 09/25/2016.    No Known Allergies Patient Active Problem List   Diagnosis Date Noted  . Pain in joint, ankle and foot 07/30/2016  . Cough 07/30/2016  . Urolithiasis 04/19/2014  . Encounter for long-term (current) use of other medications 09/09/2012  . Screening for prostate cancer 09/09/2012  . SKIN RASH 05/06/2009  . ESOPHAGEAL STRICTURE 03/22/2009  . 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 INSOMNIA 11/10/2008  . ESSENTIAL HYPERTENSION, BENIGN 01/26/2008  . HYPERCHOLESTEROLEMIA 10/24/2007  . Pain in  limb 10/24/2007  . Diabetes ([place]) 10/22/2007  . OSTEOARTHRITIS 07/30/2007  . COLONIC POLYPS, HX OF 07/30/2007   Social History   Social History  . Marital status: Widowed    Spouse name: N/A  . Number of children: N/A  . Years of education: N/A   Occupational History  . Not on file.   Social History Main Topics  . Smoking status: Never Smoker  . Smokeless tobacco: Never Used     Comment: Diet and Activity - Good  . Alcohol  use No     Comment: Rare  . Drug use: No  . Sexual activity: Not on file   Other Topics Concern  . Not on file   Social History Narrative  . No narrative on file    Derek [name]'s family history includes Cancer in his father; Heart disease in his mother; Prostate cancer in his father.      Objective:    Vitals:   09/25/16 1334  BP: 140/80  Pulse: 76    Physical Exam   well-developed older white male in no acute distress, pleasant blood pressure 140/80 pulse 76, BMI of 0.5. HEENT; nontraumatic normocephalic EOMI PERRLA sclera anicteric, Cardiovascular ;regular rate and rhythm with S1-S2 no murmur or gallop, Pulmonary ;clear bilaterally, Abdomen ;soft nontender nondistended bowel sounds are active is no palpable mass or hepatosplenomegaly is a small periumbilical hernia, Rectal ;exam not done, Extremities ;no clubbing cyanosis or edema skin warm and dry, Neuropsych; mood and affect appropriate       Assessment & Plan:   #28 75 year old white male with previous history of GERD and remote dilation of an esophageal stricture now with several month history of intermittent cough, fullness and thick mucus in his esophagus and intermittent dysphagia. Symptoms much improved on omeprazole and Pepcid. I suspect that his symptoms were related to acid reflux but not convinced that he has an esophageal stricture. Patient would like to avoid EGD if possible  #2 history of adenomatous colon polyps-last colonoscopy January 2014 due for follow-up January 2019 #3 hypertension #4 adult-onset diabetes mellitus #5 diverticulosis  Plan; We'll send omeprazole 20 mg by mouth every morning as a prescription #90 and 3 refills Continue famotidine 20 mg by mouth daily at bedtime Antireflux regimen, discussed and provided to patient We'll schedule for barium swallow with tablet . If patient has an esophageal stricture then  we'll proceed with EGD with dilation per Dr. Ardis [name].   Derek Corbo Genia Harold  PA-C 09/25/2016   Cc: Derek Rockers, MD

## 2016-09-25 NOTE — Patient Instructions (Addendum)
We sent refills to [facility], [place], [place] for Omeprazole 20 mg.  Continue the Famotidine ( Pepcid) 20 mg , 1 tablet at bedtime.   You have been scheduled for a Barium Esophogram at [facility] (1st floor of the hospital) on 11-8 at 9:30 am. Please arrive at 9:15 minutes prior to your appointment for registration. Make certain not to have anything to eat or drink 4 hours prior to your test. If you need to reschedule for any reason, please contact radiology at [phone] to do so. __________________________________________________________________ A barium swallow is an examination that concentrates on views of the esophagus. This tends to be a double contrast exam (barium and two liquids which, when combined, create a gas to distend the wall of the oesophagus) or single contrast (non-ionic iodine based). The study is usually tailored to your symptoms so a good history is essential. Attention is paid during the study to the form, structure and configuration of the esophagus, looking for functional disorders (such as aspiration, dysphagia, achalasia, motility and reflux) EXAMINATION You may be asked to change into a gown, depending on the type of swallow being performed. A radiologist and radiographer will perform the procedure. The radiologist will advise you of the type of contrast selected for your procedure and direct you during the exam. You will be asked to stand, sit or lie in several different positions and to hold a small amount of fluid in your mouth before being asked to swallow while the imaging is performed .In some instances you may be asked to swallow barium coated marshmallows to assess the motility of a solid food bolus. The exam can be recorded as a digital or video fluoroscopy procedure. POST PROCEDURE It will take 1-2 days for the barium to pass through your system. To facilitate this, it is important, unless otherwise directed, to increase your fluids for the next 24-48hrs and  to resume your normal diet.  This test typically takes about 30 minutes to perform. __________________________________________________________________________________

## 2016-09-26 NOTE — Progress Notes (Signed)
I agree with the above note, plan 

## 2016-10-10 ENCOUNTER — Ambulatory Visit (HOSPITAL_COMMUNITY): Payer: Medicare Other

## 2016-10-19 ENCOUNTER — Ambulatory Visit (HOSPITAL_COMMUNITY)
Admission: RE | Admit: 2016-10-19 | Discharge: 2016-10-19 | Disposition: A | Discharge status: Home / Self Care | Payer: Medicare Other | Source: Ambulatory Visit | Attending: Physician Assistant | Admitting: Physician Assistant

## 2016-10-19 DIAGNOSIS — K219 Gastro-esophageal reflux disease without esophagitis: Secondary | ICD-10-CM | POA: Insufficient documentation

## 2016-10-19 DIAGNOSIS — K222 Esophageal obstruction: Secondary | ICD-10-CM | POA: Diagnosis not present

## 2016-10-19 DIAGNOSIS — R1310 Dysphagia, unspecified: Secondary | ICD-10-CM | POA: Insufficient documentation

## 2016-10-19 DIAGNOSIS — R12 Heartburn: Secondary | ICD-10-CM | POA: Insufficient documentation

## 2016-10-26 ENCOUNTER — Other Ambulatory Visit: Payer: Self-pay | Admitting: Endocrinology

## 2016-10-30 ENCOUNTER — Telehealth: Payer: Self-pay | Admitting: Endocrinology

## 2016-10-30 NOTE — Telephone Encounter (Signed)
restoril rx needs to be resent please to [facility]

## 2016-11-07 ENCOUNTER — Telehealth: Payer: Self-pay

## 2016-11-07 NOTE — Telephone Encounter (Signed)
Patient declines to schedule EGD at this time.

## 2016-11-07 NOTE — Telephone Encounter (Signed)
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 Please go ahead and schedule him for EGD and dilation  With Dr Ardis Hughs in [facility]

## 2016-12-27 ENCOUNTER — Other Ambulatory Visit: Payer: Self-pay | Admitting: Endocrinology

## 2016-12-29 DIAGNOSIS — J209 Acute bronchitis, unspecified: Secondary | ICD-10-CM | POA: Diagnosis not present

## 2016-12-29 DIAGNOSIS — J019 Acute sinusitis, unspecified: Secondary | ICD-10-CM | POA: Diagnosis not present

## 2016-12-31 DIAGNOSIS — L821 Other seborrheic keratosis: Secondary | ICD-10-CM | POA: Diagnosis not present

## 2016-12-31 DIAGNOSIS — Z85820 Personal history of malignant melanoma of skin: Secondary | ICD-10-CM | POA: Diagnosis not present

## 2016-12-31 DIAGNOSIS — L309 Dermatitis, unspecified: Secondary | ICD-10-CM | POA: Diagnosis not present

## 2016-12-31 DIAGNOSIS — D1801 Hemangioma of skin and subcutaneous tissue: Secondary | ICD-10-CM | POA: Diagnosis not present

## 2016-12-31 DIAGNOSIS — L820 Inflamed seborrheic keratosis: Secondary | ICD-10-CM | POA: Diagnosis not present

## 2017-03-05 DIAGNOSIS — L02234 Carbuncle of groin: Secondary | ICD-10-CM | POA: Diagnosis not present

## 2017-03-18 ENCOUNTER — Other Ambulatory Visit: Payer: Self-pay | Admitting: Endocrinology

## 2017-05-07 ENCOUNTER — Telehealth: Payer: Self-pay | Admitting: Endocrinology

## 2017-05-07 ENCOUNTER — Other Ambulatory Visit: Payer: Self-pay

## 2017-05-07 MED ORDER — TEMAZEPAM 30 MG PO CAPS: ORAL_CAPSULE | ORAL | 1 refills | Status: DC

## 2017-05-07 NOTE — Telephone Encounter (Signed)
Submitted refill. Printed, will fax.

## 2017-05-07 NOTE — Telephone Encounter (Signed)
Patient says he has no more refills on temazepam (RESTORIL) 30 MG capsule. He uses [facility] [place], [place].  Please advise.  Thank you,  -LL

## 2017-05-28 DIAGNOSIS — Z23 Encounter for immunization: Secondary | ICD-10-CM | POA: Diagnosis not present

## 2017-05-28 DIAGNOSIS — I10 Essential (primary) hypertension: Secondary | ICD-10-CM | POA: Diagnosis not present

## 2017-05-28 DIAGNOSIS — K219 Gastro-esophageal reflux disease without esophagitis: Secondary | ICD-10-CM | POA: Diagnosis not present

## 2017-05-28 DIAGNOSIS — Z8249 Family history of ischemic heart disease and other diseases of the circulatory system: Secondary | ICD-10-CM | POA: Diagnosis not present

## 2017-05-28 DIAGNOSIS — G4700 Insomnia, unspecified: Secondary | ICD-10-CM | POA: Diagnosis not present

## 2017-05-28 DIAGNOSIS — E118 Type 2 diabetes mellitus with unspecified complications: Secondary | ICD-10-CM | POA: Diagnosis not present

## 2017-05-28 DIAGNOSIS — E785 Hyperlipidemia, unspecified: Secondary | ICD-10-CM | POA: Diagnosis not present

## 2017-05-28 DIAGNOSIS — E7800 Pure hypercholesterolemia, unspecified: Secondary | ICD-10-CM | POA: Diagnosis not present

## 2017-05-28 DIAGNOSIS — Z125 Encounter for screening for malignant neoplasm of prostate: Secondary | ICD-10-CM | POA: Diagnosis not present

## 2017-05-29 DIAGNOSIS — L570 Actinic keratosis: Secondary | ICD-10-CM | POA: Diagnosis not present

## 2017-07-01 DIAGNOSIS — Z85828 Personal history of other malignant neoplasm of skin: Secondary | ICD-10-CM | POA: Diagnosis not present

## 2017-07-01 DIAGNOSIS — D225 Melanocytic nevi of trunk: Secondary | ICD-10-CM | POA: Diagnosis not present

## 2017-07-01 DIAGNOSIS — L821 Other seborrheic keratosis: Secondary | ICD-10-CM | POA: Diagnosis not present

## 2017-07-01 DIAGNOSIS — D1801 Hemangioma of skin and subcutaneous tissue: Secondary | ICD-10-CM | POA: Diagnosis not present

## 2017-07-01 DIAGNOSIS — L570 Actinic keratosis: Secondary | ICD-10-CM | POA: Diagnosis not present

## 2017-07-01 DIAGNOSIS — L820 Inflamed seborrheic keratosis: Secondary | ICD-10-CM | POA: Diagnosis not present

## 2017-07-01 DIAGNOSIS — Z85820 Personal history of malignant melanoma of skin: Secondary | ICD-10-CM | POA: Diagnosis not present

## 2017-07-01 DIAGNOSIS — L814 Other melanin hyperpigmentation: Secondary | ICD-10-CM | POA: Diagnosis not present

## 2017-07-09 DIAGNOSIS — E113293 Type 2 diabetes mellitus with mild nonproliferative diabetic retinopathy without macular edema, bilateral: Secondary | ICD-10-CM | POA: Diagnosis not present

## 2017-07-12 ENCOUNTER — Telehealth: Payer: Self-pay | Admitting: Endocrinology

## 2017-07-12 NOTE — Telephone Encounter (Signed)
Routing to you °

## 2017-07-12 NOTE — Telephone Encounter (Signed)
Patient called in reference to taking Nugenix along with all his other medications. Please call patient and advise. OK to leave message.

## 2017-07-15 NOTE — Telephone Encounter (Signed)
This medication is unregulated, so there is no telling what is in it.  However, it is unlikely to harm you.

## 2017-07-15 NOTE — Telephone Encounter (Signed)
Called and left patient a detailed VM that medication was unregulated & most likely not to harm him. He could call back with questions if he had any.

## 2017-07-24 ENCOUNTER — Other Ambulatory Visit: Payer: Self-pay | Admitting: Endocrinology

## 2017-07-30 ENCOUNTER — Ambulatory Visit: Payer: Medicare Other | Admitting: Endocrinology

## 2017-08-14 DIAGNOSIS — Z7982 Long term (current) use of aspirin: Secondary | ICD-10-CM | POA: Diagnosis not present

## 2017-08-14 DIAGNOSIS — W268XXA Contact with other sharp object(s), not elsewhere classified, initial encounter: Secondary | ICD-10-CM | POA: Diagnosis not present

## 2017-08-14 DIAGNOSIS — S61217A Laceration without foreign body of left little finger without damage to nail, initial encounter: Secondary | ICD-10-CM | POA: Diagnosis not present

## 2017-09-06 DIAGNOSIS — K219 Gastro-esophageal reflux disease without esophagitis: Secondary | ICD-10-CM | POA: Diagnosis not present

## 2017-09-06 DIAGNOSIS — E118 Type 2 diabetes mellitus with unspecified complications: Secondary | ICD-10-CM | POA: Diagnosis not present

## 2017-09-06 DIAGNOSIS — E7800 Pure hypercholesterolemia, unspecified: Secondary | ICD-10-CM | POA: Diagnosis not present

## 2017-09-06 DIAGNOSIS — F5101 Primary insomnia: Secondary | ICD-10-CM | POA: Diagnosis not present

## 2017-10-10 ENCOUNTER — Other Ambulatory Visit: Payer: Self-pay | Admitting: Physician Assistant

## 2017-10-29 ENCOUNTER — Other Ambulatory Visit: Payer: Self-pay

## 2017-10-30 ENCOUNTER — Telehealth: Payer: Self-pay | Admitting: Endocrinology

## 2017-10-30 NOTE — Telephone Encounter (Signed)
Patient has been waiting for his prescriptions since last Friday. His pharmacy told him they have nothing for him. Please send prescriptions for Januvia and Clonazepam to [facility] in [place]

## 2017-10-31 ENCOUNTER — Other Ambulatory Visit: Payer: Self-pay

## 2017-10-31 ENCOUNTER — Other Ambulatory Visit: Payer: Self-pay | Admitting: Endocrinology

## 2017-10-31 MED ORDER — SITAGLIPTIN PHOSPHATE 100 MG PO TABS: 100.0000 mg | ORAL_TABLET | Freq: Every day | ORAL | 11 refills | Status: DC

## 2017-10-31 MED ORDER — TEMAZEPAM 30 MG PO CAPS: ORAL_CAPSULE | ORAL | 1 refills | Status: AC

## 2017-10-31 MED ORDER — SITAGLIPTIN PHOSPHATE 100 MG PO TABS: 100.0000 mg | ORAL_TABLET | Freq: Every day | ORAL | 99 refills | Status: AC

## 2017-10-31 NOTE — Telephone Encounter (Signed)
I printed temazepam rx.  Please refill januvia prn

## 2017-10-31 NOTE — Telephone Encounter (Signed)
error 

## 2017-11-18 ENCOUNTER — Other Ambulatory Visit: Payer: Self-pay

## 2017-11-18 MED ORDER — METFORMIN HCL ER 500 MG PO TB24: ORAL_TABLET | ORAL | 1 refills | Status: DC

## 2017-11-22 ENCOUNTER — Other Ambulatory Visit: Payer: Self-pay

## 2017-11-22 MED ORDER — ROSUVASTATIN CALCIUM 40 MG PO TABS: 40.0000 mg | ORAL_TABLET | Freq: Every day | ORAL | 2 refills | Status: AC

## 2017-12-10 DIAGNOSIS — E119 Type 2 diabetes mellitus without complications: Secondary | ICD-10-CM | POA: Diagnosis not present

## 2017-12-10 DIAGNOSIS — C4330 Malignant melanoma of unspecified part of face: Secondary | ICD-10-CM | POA: Diagnosis not present

## 2017-12-10 DIAGNOSIS — K219 Gastro-esophageal reflux disease without esophagitis: Secondary | ICD-10-CM | POA: Diagnosis not present

## 2017-12-10 DIAGNOSIS — F5101 Primary insomnia: Secondary | ICD-10-CM | POA: Diagnosis not present

## 2017-12-10 DIAGNOSIS — E7800 Pure hypercholesterolemia, unspecified: Secondary | ICD-10-CM | POA: Diagnosis not present

## 2018-01-01 DIAGNOSIS — D229 Melanocytic nevi, unspecified: Secondary | ICD-10-CM | POA: Diagnosis not present

## 2018-01-01 DIAGNOSIS — L309 Dermatitis, unspecified: Secondary | ICD-10-CM | POA: Diagnosis not present

## 2018-01-01 DIAGNOSIS — Z85820 Personal history of malignant melanoma of skin: Secondary | ICD-10-CM | POA: Diagnosis not present

## 2018-01-01 DIAGNOSIS — L821 Other seborrheic keratosis: Secondary | ICD-10-CM | POA: Diagnosis not present

## 2018-01-01 DIAGNOSIS — L820 Inflamed seborrheic keratosis: Secondary | ICD-10-CM | POA: Diagnosis not present

## 2018-01-03 ENCOUNTER — Encounter: Payer: Self-pay | Admitting: Gastroenterology

## 2018-03-11 DIAGNOSIS — Z0000 Encounter for general adult medical examination without abnormal findings: Secondary | ICD-10-CM | POA: Diagnosis not present

## 2018-03-11 DIAGNOSIS — Z1211 Encounter for screening for malignant neoplasm of colon: Secondary | ICD-10-CM | POA: Diagnosis not present

## 2018-03-11 DIAGNOSIS — E118 Type 2 diabetes mellitus with unspecified complications: Secondary | ICD-10-CM | POA: Diagnosis not present

## 2018-05-27 DIAGNOSIS — R1033 Periumbilical pain: Secondary | ICD-10-CM | POA: Diagnosis not present

## 2018-05-27 DIAGNOSIS — Z96653 Presence of artificial knee joint, bilateral: Secondary | ICD-10-CM | POA: Diagnosis not present

## 2018-05-27 DIAGNOSIS — K429 Umbilical hernia without obstruction or gangrene: Secondary | ICD-10-CM | POA: Diagnosis not present

## 2018-05-27 DIAGNOSIS — N200 Calculus of kidney: Secondary | ICD-10-CM | POA: Diagnosis not present

## 2018-05-27 DIAGNOSIS — K420 Umbilical hernia with obstruction, without gangrene: Secondary | ICD-10-CM | POA: Diagnosis not present

## 2018-05-27 DIAGNOSIS — K4020 Bilateral inguinal hernia, without obstruction or gangrene, not specified as recurrent: Secondary | ICD-10-CM | POA: Diagnosis not present

## 2018-05-27 DIAGNOSIS — E785 Hyperlipidemia, unspecified: Secondary | ICD-10-CM | POA: Diagnosis not present

## 2018-05-27 DIAGNOSIS — E119 Type 2 diabetes mellitus without complications: Secondary | ICD-10-CM | POA: Diagnosis not present

## 2018-05-27 DIAGNOSIS — L539 Erythematous condition, unspecified: Secondary | ICD-10-CM | POA: Diagnosis not present

## 2018-05-27 DIAGNOSIS — E7800 Pure hypercholesterolemia, unspecified: Secondary | ICD-10-CM | POA: Diagnosis not present

## 2018-06-03 DIAGNOSIS — K219 Gastro-esophageal reflux disease without esophagitis: Secondary | ICD-10-CM | POA: Diagnosis not present

## 2018-06-03 DIAGNOSIS — E119 Type 2 diabetes mellitus without complications: Secondary | ICD-10-CM | POA: Diagnosis not present

## 2018-06-03 DIAGNOSIS — K429 Umbilical hernia without obstruction or gangrene: Secondary | ICD-10-CM | POA: Diagnosis not present

## 2018-06-03 DIAGNOSIS — K4020 Bilateral inguinal hernia, without obstruction or gangrene, not specified as recurrent: Secondary | ICD-10-CM | POA: Diagnosis not present

## 2018-06-03 DIAGNOSIS — K420 Umbilical hernia with obstruction, without gangrene: Secondary | ICD-10-CM | POA: Diagnosis not present

## 2018-06-03 DIAGNOSIS — Z96653 Presence of artificial knee joint, bilateral: Secondary | ICD-10-CM | POA: Diagnosis not present

## 2018-06-03 DIAGNOSIS — E7800 Pure hypercholesterolemia, unspecified: Secondary | ICD-10-CM | POA: Diagnosis not present

## 2018-06-03 DIAGNOSIS — Z79899 Other long term (current) drug therapy: Secondary | ICD-10-CM | POA: Diagnosis not present

## 2018-06-10 DIAGNOSIS — C4330 Malignant melanoma of unspecified part of face: Secondary | ICD-10-CM | POA: Diagnosis not present

## 2018-06-10 DIAGNOSIS — F5101 Primary insomnia: Secondary | ICD-10-CM | POA: Diagnosis not present

## 2018-06-10 DIAGNOSIS — K219 Gastro-esophageal reflux disease without esophagitis: Secondary | ICD-10-CM | POA: Diagnosis not present

## 2018-06-10 DIAGNOSIS — E119 Type 2 diabetes mellitus without complications: Secondary | ICD-10-CM | POA: Diagnosis not present

## 2018-06-10 DIAGNOSIS — E7800 Pure hypercholesterolemia, unspecified: Secondary | ICD-10-CM | POA: Diagnosis not present

## 2018-06-24 DIAGNOSIS — K449 Diaphragmatic hernia without obstruction or gangrene: Secondary | ICD-10-CM | POA: Diagnosis not present

## 2018-06-24 DIAGNOSIS — Z86010 Personal history of colonic polyps: Secondary | ICD-10-CM | POA: Diagnosis not present

## 2018-06-24 DIAGNOSIS — K649 Unspecified hemorrhoids: Secondary | ICD-10-CM | POA: Diagnosis not present

## 2018-06-24 DIAGNOSIS — K219 Gastro-esophageal reflux disease without esophagitis: Secondary | ICD-10-CM | POA: Diagnosis not present

## 2018-06-24 DIAGNOSIS — Z1211 Encounter for screening for malignant neoplasm of colon: Secondary | ICD-10-CM | POA: Diagnosis not present

## 2018-06-24 DIAGNOSIS — D122 Benign neoplasm of ascending colon: Secondary | ICD-10-CM | POA: Diagnosis not present

## 2018-06-24 DIAGNOSIS — D123 Benign neoplasm of transverse colon: Secondary | ICD-10-CM | POA: Diagnosis not present

## 2018-06-24 DIAGNOSIS — K222 Esophageal obstruction: Secondary | ICD-10-CM | POA: Diagnosis not present

## 2018-06-24 DIAGNOSIS — K5730 Diverticulosis of large intestine without perforation or abscess without bleeding: Secondary | ICD-10-CM | POA: Diagnosis not present

## 2018-06-24 DIAGNOSIS — R12 Heartburn: Secondary | ICD-10-CM | POA: Diagnosis not present

## 2018-07-01 DIAGNOSIS — D229 Melanocytic nevi, unspecified: Secondary | ICD-10-CM | POA: Diagnosis not present

## 2018-07-01 DIAGNOSIS — D1801 Hemangioma of skin and subcutaneous tissue: Secondary | ICD-10-CM | POA: Diagnosis not present

## 2018-07-01 DIAGNOSIS — D485 Neoplasm of uncertain behavior of skin: Secondary | ICD-10-CM | POA: Diagnosis not present

## 2018-07-01 DIAGNOSIS — L570 Actinic keratosis: Secondary | ICD-10-CM | POA: Diagnosis not present

## 2018-07-01 DIAGNOSIS — L821 Other seborrheic keratosis: Secondary | ICD-10-CM | POA: Diagnosis not present

## 2018-07-01 DIAGNOSIS — Z85820 Personal history of malignant melanoma of skin: Secondary | ICD-10-CM | POA: Diagnosis not present

## 2018-08-20 DIAGNOSIS — L723 Sebaceous cyst: Secondary | ICD-10-CM | POA: Diagnosis not present

## 2018-08-20 DIAGNOSIS — D485 Neoplasm of uncertain behavior of skin: Secondary | ICD-10-CM | POA: Diagnosis not present

## 2018-09-10 ENCOUNTER — Other Ambulatory Visit: Payer: Self-pay | Admitting: Endocrinology

## 2018-09-11 DIAGNOSIS — E7800 Pure hypercholesterolemia, unspecified: Secondary | ICD-10-CM | POA: Diagnosis not present

## 2018-09-11 DIAGNOSIS — J019 Acute sinusitis, unspecified: Secondary | ICD-10-CM | POA: Diagnosis not present

## 2018-09-11 DIAGNOSIS — C4330 Malignant melanoma of unspecified part of face: Secondary | ICD-10-CM | POA: Diagnosis not present

## 2018-09-11 DIAGNOSIS — E119 Type 2 diabetes mellitus without complications: Secondary | ICD-10-CM | POA: Diagnosis not present

## 2018-09-11 DIAGNOSIS — K219 Gastro-esophageal reflux disease without esophagitis: Secondary | ICD-10-CM | POA: Diagnosis not present

## 2018-09-11 DIAGNOSIS — Z125 Encounter for screening for malignant neoplasm of prostate: Secondary | ICD-10-CM | POA: Diagnosis not present

## 2018-09-11 DIAGNOSIS — F5101 Primary insomnia: Secondary | ICD-10-CM | POA: Diagnosis not present

## 2018-09-15 DIAGNOSIS — M25561 Pain in right knee: Secondary | ICD-10-CM | POA: Diagnosis not present

## 2018-09-15 DIAGNOSIS — Z96651 Presence of right artificial knee joint: Secondary | ICD-10-CM | POA: Diagnosis not present

## 2018-09-15 DIAGNOSIS — G8929 Other chronic pain: Secondary | ICD-10-CM | POA: Diagnosis not present

## 2018-09-15 DIAGNOSIS — M25562 Pain in left knee: Secondary | ICD-10-CM | POA: Diagnosis not present

## 2018-09-15 DIAGNOSIS — M25861 Other specified joint disorders, right knee: Secondary | ICD-10-CM | POA: Diagnosis not present

## 2018-09-15 DIAGNOSIS — Z96652 Presence of left artificial knee joint: Secondary | ICD-10-CM | POA: Diagnosis not present

## 2018-09-15 DIAGNOSIS — Z96653 Presence of artificial knee joint, bilateral: Secondary | ICD-10-CM | POA: Diagnosis not present

## 2018-10-07 DIAGNOSIS — S0181XA Laceration without foreign body of other part of head, initial encounter: Secondary | ICD-10-CM | POA: Diagnosis not present

## 2018-10-25 ENCOUNTER — Other Ambulatory Visit: Payer: Self-pay | Admitting: Physician Assistant

## 2019-02-19 ENCOUNTER — Telehealth: Payer: Self-pay | Admitting: Endocrinology

## 2019-02-19 NOTE — Telephone Encounter (Signed)
MEDICATION: Januvia  PHARMACY:  [facility] [place]  IS THIS A 90 DAY SUPPLY :yes  IS PATIENT OUT OF MEDICATION: yes  IF NOT; HOW MUCH IS LEFT:   LAST APPOINTMENT DATE: @10 /08/2018  NEXT APPOINTMENT DATE:@Visit  date not found  DO WE HAVE YOUR PERMISSION TO LEAVE A DETAILED MESSAGE:   OTHER COMMENTS:    **Let patient know to contact pharmacy at the end of the day to make sure medication is ready. **  ** Please notify patient to allow 48-72 hours to process**  **Encourage patient to contact the pharmacy for refills or they can request refills through [facility]**

## 2019-02-20 NOTE — Telephone Encounter (Signed)
Called pt and informed that we have not seen him in 3 years. Will require an appt. Refill cannot be authorized at this time without an appt. States he will call back to schedule.

## 2019-03-24 ENCOUNTER — Other Ambulatory Visit: Payer: Self-pay | Admitting: Physician Assistant

## 2022-10-09 ENCOUNTER — Ambulatory Visit (INDEPENDENT_AMBULATORY_CARE_PROVIDER_SITE_OTHER): Payer: Medicare Other

## 2022-10-09 ENCOUNTER — Ambulatory Visit (INDEPENDENT_AMBULATORY_CARE_PROVIDER_SITE_OTHER): Payer: Medicare Other | Admitting: Podiatry

## 2022-10-09 DIAGNOSIS — M19071 Primary osteoarthritis, right ankle and foot: Secondary | ICD-10-CM

## 2022-10-09 DIAGNOSIS — Z01818 Encounter for other preprocedural examination: Secondary | ICD-10-CM

## 2022-10-09 DIAGNOSIS — M79671 Pain in right foot: Secondary | ICD-10-CM

## 2022-10-16 NOTE — Progress Notes (Signed)
Subjective:  Patient ID: Derek [name], male    DOB: Feb 03, 1941,  MRN: [id number]  Chief Complaint  Patient presents with   Toe Pain    81 y.o. male presents with the above complaint.  Patient presents with right first metatarsophalangeal joint pain.  Patient states pain for touch is progressive gotten worse.  He has a lot of discomfort of the big toe.  He has tried conservative care include shoe gear modification padding offloading none of which has helped.  He would like to discuss surgical options at this time.  Pain scale 7 out of 10 dull achy in nature pain with range of motion he is a diabetic with last A1c of 7.0.   Review of Systems: Negative except as noted in the HPI. Denies N/V/F/Ch.  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   ESOPHAGEAL STRICTURE 03/22/2009   Essential hypertension, benign 01/26/2008   GERD (gastroesophageal reflux disease)    History of kidney stones    HYPERCHOLESTEROLEMIA 10/24/2007   INSOMNIA 11/10/2008   OSTEOARTHRITIS 07/30/2007   Pain in Soft Tissues of Limb 10/24/2007   SKIN RASH 05/06/2009    Current Outpatient Medications:    aspirin 81 MG tablet, Take 81 mg by mouth daily., Disp: , Rfl:    famotidine (PEPCID) 20 MG tablet, Take 20 mg by mouth at bedtime., Disp: , Rfl:    ibuprofen (ADVIL,MOTRIN) 200 MG tablet, Take 200 mg by mouth every 6 (six) hours as needed., Disp: , Rfl:    metFORMIN (GLUCOPHAGE-XR) 500 MG 24 hr tablet, take 2 tablets by mouth once daily with breakfast, Disp: 180 tablet, Rfl: 1   omeprazole (PRILOSEC) 20 MG capsule, TAKE 1 TABLET BY MOUTH EVERY MORNING, Disp: 30 capsule, Rfl: 0   rosuvastatin (CRESTOR) 40 MG tablet, Take 1 tablet (40 mg total) by mouth daily., Disp: 90 tablet, Rfl: 2   sitaGLIPtin (JANUVIA) 100 MG tablet, Take 1 tablet (100 mg total) by mouth daily., Disp: 30 tablet, Rfl: prn   temazepam  (RESTORIL) 30 MG capsule, take 1 capsule by mouth at bedtime if needed, Disp: 90 capsule, Rfl: 1  Social History   Tobacco Use  Smoking Status Never  Smokeless Tobacco Never  Tobacco Comments   Diet and Activity - Good    No Known Allergies Objective:  There were no vitals filed for this visit. There is no height or weight on file to calculate BMI. Constitutional Well developed. Well nourished.  Vascular Dorsalis pedis pulses palpable bilaterally. Posterior tibial pulses palpable bilaterally. Capillary refill normal to all digits.  No cyanosis or clubbing noted. Pedal hair growth normal.  Neurologic Normal speech. Oriented to person, place, and time. Epicritic sensation to light touch grossly present bilaterally.  Dermatologic Nails well groomed and normal in appearance. No open wounds. No skin lesions.  Orthopedic: Pain on palpation of right first metatarsophalangeal joint pain with range of motion of the joint.  Deep intra-articular pain noted.  No pain at the sesamoidal complex.  No extensor or flexor tendinitis noted.   Radiographs: 3 views of skeletally mature the right foot: Arthritis noted to the first metatarsophalangeal joint.  Pes planovalgus foot structure note foot noted.  Plantar and posterior heel spurring noted.  No other bony abnormalities noted.  d.  Arthritis to the mid Assessment:   1. Right foot pain    Plan:  Patient was evaluated and treated  and all questions answered.  Right first metatarsophalangeal joint arthritis -All questions and concerns were discussed with the patient in extensive detail given the amount of pain that he is experiencing in the setting of all failed conservative care including padding shoe gear modification offloading I believe patient will benefit from surgical first metatarsophalangeal joint fusion with possible allograft.  I discussed my preoperative intraoperative postoperative plan in extensive detail with the patient.  He  states understand like to proceed with surgery.  I encouraged him that he will need to be nonweightbearing to the right lower extremity as well.  He states understanding and will be able to do so -Informed surgical risk consent was reviewed and read aloud to the patient.  I reviewed the films.  I have discussed my findings with the patient in great detail.  I have discussed all risks including but not limited to infection, stiffness, scarring, limp, disability, deformity, damage to blood vessels and nerves, numbness, poor healing, need for braces, arthritis, chronic pain, amputation, death.  All benefits and realistic expectations discussed in great detail.  I have made no promises as to the outcome.  I have provided realistic expectations.  I have offered the patient a 2nd opinion, which they have declined and assured me they preferred to proceed despite the risks -He is a diabetic however his A1c is below 8% and therefore I discussed the risk and complication associated with it.  I do feel comfortable proceeding given that he is below the elective surgery cut off.   No follow-ups on file.

## 2023-02-05 ENCOUNTER — Telehealth: Payer: Self-pay

## 2023-02-05 NOTE — Telephone Encounter (Signed)
Misty, daughter, called to cancel [facility] surgery with Dr. Posey Pronto on 02/11/2023. He has a good friend pass away. Notified Dr. Posey Pronto

## 2023-02-11 ENCOUNTER — Encounter (HOSPITAL_BASED_OUTPATIENT_CLINIC_OR_DEPARTMENT_OTHER): Admission: RE | Payer: Self-pay | Source: Home / Self Care

## 2023-02-11 ENCOUNTER — Ambulatory Visit (HOSPITAL_BASED_OUTPATIENT_CLINIC_OR_DEPARTMENT_OTHER): Admission: RE | Admit: 2023-02-11 | Payer: Medicare Other | Source: Home / Self Care | Admitting: Podiatry

## 2023-02-11 SURGERY — FUSION, JOINT, GREAT TOE
Anesthesia: Choice | Site: Toe | Laterality: Right

## 2023-02-19 ENCOUNTER — Encounter: Payer: Medicare Other | Admitting: Podiatry

## 2023-02-22 ENCOUNTER — Encounter: Payer: Medicare Other | Admitting: Podiatry

## 2023-03-06 ENCOUNTER — Encounter: Payer: Medicare Other | Admitting: Podiatry

## 2023-03-07 ENCOUNTER — Encounter: Payer: Medicare Other | Admitting: Podiatry

## 2023-03-19 ENCOUNTER — Encounter: Payer: Medicare Other | Admitting: Podiatry
